# Patient Record
Sex: Female | Born: 1965 | Race: White | Hispanic: No | Marital: Single | Smoking: Current every day smoker
Health system: Southern US, Community
[De-identification: ages and names within clinical notes are randomized; demographics above are authoritative.]

## PROBLEM LIST (undated history)

## (undated) ENCOUNTER — Emergency Department (HOSPITAL_BASED_OUTPATIENT_CLINIC_OR_DEPARTMENT_OTHER): Admission: EM | Payer: BLUE CROSS/BLUE SHIELD

## (undated) DIAGNOSIS — R7303 Prediabetes: Secondary | ICD-10-CM

## (undated) DIAGNOSIS — J302 Other seasonal allergic rhinitis: Secondary | ICD-10-CM

## (undated) HISTORY — PX: BACK SURGERY: SHX140

## (undated) HISTORY — PX: OTHER SURGICAL HISTORY: SHX169

## (undated) HISTORY — DX: Prediabetes: R73.03

## (undated) HISTORY — PX: MOUTH SURGERY: SHX715

## (undated) HISTORY — PX: TYMPANOSTOMY TUBE PLACEMENT: SHX32

## (undated) HISTORY — PX: TONSILLECTOMY AND ADENOIDECTOMY: SHX28

## (undated) HISTORY — PX: TUBAL LIGATION: SHX77

## (undated) HISTORY — DX: Other seasonal allergic rhinitis: J30.2

---

## 2005-09-14 ENCOUNTER — Encounter (INDEPENDENT_AMBULATORY_CARE_PROVIDER_SITE_OTHER): Payer: Self-pay | Admitting: *Deleted

## 2005-09-15 ENCOUNTER — Ambulatory Visit (HOSPITAL_COMMUNITY): Admission: RE | Admit: 2005-09-15 | Discharge: 2005-09-15 | Payer: Self-pay | Admitting: Orthopedic Surgery

## 2008-11-18 ENCOUNTER — Other Ambulatory Visit: Admission: RE | Admit: 2008-11-18 | Discharge: 2008-11-18 | Payer: Self-pay | Admitting: Obstetrics & Gynecology

## 2010-05-17 ENCOUNTER — Other Ambulatory Visit (HOSPITAL_COMMUNITY)
Admission: RE | Admit: 2010-05-17 | Discharge: 2010-05-17 | Disposition: A | Discharge status: Home / Self Care | Payer: BC Managed Care – PPO | Source: Ambulatory Visit | Attending: Obstetrics & Gynecology | Admitting: Obstetrics & Gynecology

## 2010-05-17 ENCOUNTER — Other Ambulatory Visit: Payer: Self-pay | Admitting: Obstetrics & Gynecology

## 2010-05-17 DIAGNOSIS — Z01419 Encounter for gynecological examination (general) (routine) without abnormal findings: Secondary | ICD-10-CM | POA: Insufficient documentation

## 2010-05-17 DIAGNOSIS — Z113 Encounter for screening for infections with a predominantly sexual mode of transmission: Secondary | ICD-10-CM | POA: Insufficient documentation

## 2010-06-03 NOTE — Op Note (Signed)
NAME:  Christine Schmitt, JOY NO.:  [id number]   MEDICAL RECORD NO.:  [id number]          PATIENT TYPE:  AMB   LOCATION:  DAY                          FACILITY:  [facility]   PHYSICIAN:  Marlowe Kays, M.D.  DATE OF BIRTH:  June 23, 1965   DATE OF PROCEDURE:  09/14/2005  DATE OF DISCHARGE:                                 OPERATIVE REPORT   PREOPERATIVE DIAGNOSIS:  Herniated nucleus pulposus L4-5, L5-S1, left.   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   OPERATION:  Microdiskectomy L4-5, L5-S1 left.   SURGEON:  Marlowe Kays, M.D.   ASSISTANT:  Georges Lynch. Darrelyn Hillock, M.D.   ANESTHESIA:  General.   PATHOLOGY AND JUSTIFICATION FOR PROCEDURE:  She has had pain in back and  left leg since around the first of June of this year.  MRIs demonstrated  disk herniations at both levels L4-5 and L5-S1 central with some right-sided  involvement though her symptoms have been on the left.  She has numbness  over the L5-S1 nerve root distribution so it is felt that microdiskectomy  both levels was indicated and since she was symptomatic only left side we  went in on that side only.  All this was thoroughly discussed with her prior  to surgery.   PROCEDURE NOTE:  Prophylactic antibiotic, satisfactory general anesthesia,  knee-chest position on the [place] frame.  Back was prepped with DuraPrep  and with two spinal needles and lateral x-ray, we tentatively located L4-5  and L5-S1.  We then continued draping the back in sterile field.  Made  midline incision and dissected soft tissue off what we anticipated to be the  lamina of L4 and L5.  I tagged these two spinous processes with Kocher  clamps took a second lateral x-ray and Kocher clamps appeared to be on the  spinous process L4 and L5.  Based on this I continued dissecting soft tissue  off the lamina of L4, L5 and S1 placed self-retaining McCullough retractor  and then began at L5-S1 removing a portion of  the inferior lamina of L5 with  double-action rongeur followed by 2-3 mm Kerrison rongeurs.  She had fairly  significant shingling effect there and had to remove a good bit of bone for  adequate exposure.  I then performed same procedure L4-5 removing portion of  the inferior arch of L4 with a similar dissection.  At this point I brought  in the microscope and working first L5-S1, completed the decompression.  We  identified the S1 nerve root and were able to remove it medially.  She had  large venous plexus laterally which I cauterized with bipolar cautery.  The  disk herniation was identified and opened with a 15 knife blade, the disk  space was narrow.  There was less disk material there than would have been  apparent on the MRI.  We looked for free fragments and found none.  We  removed all disk material obtainable from the interspace with combination of  straight and angled up bite pituitaries, Epstein curette and nerve hook.  The posterior longitudinal ligament  was quite thickened and a lot of the  bulge appeared to be due to spondylosis.  The fairly significant lateral  recess stenosis was thoroughly decompressed and at conclusion of the  microdiskectomy at this level her S1 nerve root was lying free in the  foramen and was freely movable.  We then irrigated the wound well, placed  Gelfoam over the dura and the interspace and moved to L4-5, where we  performed essentially the same final dissection.  There was actually more  disk material L4-5 and we once again removed all possible treating this  interspace as we had L5-S1.  Once again irrigation Gelfoam was placed, self-  retaining retractors were removed, there was no unusual bleeding.  I then  began closure with interrupted #1 Vicryl in the fascia, she is given 30 mg  of Toradol IV.  I used a #1 the deep  subcutaneous tissue, 2-0 Vicryl superficial subcutaneous tissue, staples in  the skin, subcu tissue was infiltrated with half  percent plain Marcaine.  Betadine Adaptic dry sterile dressing were applied.  She tolerated the  procedure well and was taken to recovery in satisfactory condition with no  known complications.           ______________________________  Marlowe Kays, M.D.     JA/MEDQ  D:  09/14/2005  T:  09/15/2005  Job:  [id number]

## 2017-04-17 ENCOUNTER — Encounter: Payer: Self-pay | Admitting: Gastroenterology

## 2017-05-15 ENCOUNTER — Ambulatory Visit: Payer: Self-pay

## 2019-08-11 ENCOUNTER — Other Ambulatory Visit (HOSPITAL_COMMUNITY): Payer: Self-pay | Admitting: Internal Medicine

## 2019-08-11 DIAGNOSIS — Z1231 Encounter for screening mammogram for malignant neoplasm of breast: Secondary | ICD-10-CM

## 2019-08-22 ENCOUNTER — Encounter: Payer: Self-pay | Admitting: Internal Medicine

## 2019-09-03 ENCOUNTER — Ambulatory Visit (HOSPITAL_COMMUNITY)
Admission: RE | Admit: 2019-09-03 | Discharge: 2019-09-03 | Disposition: A | Discharge status: Home / Self Care | Payer: Managed Care, Other (non HMO) | Source: Ambulatory Visit | Attending: Internal Medicine | Admitting: Internal Medicine

## 2019-09-03 ENCOUNTER — Other Ambulatory Visit: Payer: Self-pay

## 2019-09-03 DIAGNOSIS — Z1231 Encounter for screening mammogram for malignant neoplasm of breast: Secondary | ICD-10-CM | POA: Diagnosis present

## 2019-09-12 ENCOUNTER — Other Ambulatory Visit (HOSPITAL_COMMUNITY): Payer: Self-pay | Admitting: Internal Medicine

## 2019-09-12 DIAGNOSIS — R928 Other abnormal and inconclusive findings on diagnostic imaging of breast: Secondary | ICD-10-CM

## 2019-09-30 ENCOUNTER — Ambulatory Visit (HOSPITAL_COMMUNITY): Payer: Managed Care, Other (non HMO)

## 2019-09-30 ENCOUNTER — Encounter (HOSPITAL_COMMUNITY): Payer: Managed Care, Other (non HMO)

## 2019-10-21 ENCOUNTER — Ambulatory Visit (HOSPITAL_COMMUNITY)
Admission: RE | Admit: 2019-10-21 | Discharge: 2019-10-21 | Disposition: A | Discharge status: Home / Self Care | Payer: Managed Care, Other (non HMO) | Source: Ambulatory Visit | Attending: Internal Medicine | Admitting: Internal Medicine

## 2019-10-21 ENCOUNTER — Other Ambulatory Visit: Payer: Self-pay

## 2019-10-21 DIAGNOSIS — R928 Other abnormal and inconclusive findings on diagnostic imaging of breast: Secondary | ICD-10-CM

## 2019-10-23 ENCOUNTER — Ambulatory Visit: Payer: Managed Care, Other (non HMO) | Admitting: Gastroenterology

## 2019-10-29 ENCOUNTER — Encounter: Payer: Self-pay | Admitting: Gastroenterology

## 2019-10-29 ENCOUNTER — Encounter: Payer: Self-pay | Admitting: *Deleted

## 2019-10-29 ENCOUNTER — Other Ambulatory Visit: Payer: Self-pay

## 2019-10-29 ENCOUNTER — Ambulatory Visit (INDEPENDENT_AMBULATORY_CARE_PROVIDER_SITE_OTHER): Payer: Managed Care, Other (non HMO) | Admitting: Gastroenterology

## 2019-10-29 DIAGNOSIS — Z1211 Encounter for screening for malignant neoplasm of colon: Secondary | ICD-10-CM | POA: Diagnosis not present

## 2019-10-29 NOTE — Progress Notes (Signed)
Primary Care Physician:  Benita Stabile, MD  Referring Physician: Dr. Margo Aye  Primary Gastroenterologist:  Dr. Marletta Lor   Chief Complaint  Patient presents with  . Consult    TCS never done prior. MGM colon cancer in 60's    HPI:   Christine Schmitt is a 54 y.o. female presenting today at the request of Dr. Margo Aye for initial screening colonoscopy.    Reports her maternal grandmother had colon cancer in her 81s, but she has no first degree relatives with history of colon cancer. No family history of colon cancer. No abdominal pain, N/V, changes in bowel habits, overt GI bleeding, unexplained weight loss or lack of appetite. Rare reflux depending on what she eats. No dysphagia.   Past Medical History:  Diagnosis Date  . Pre-diabetes   . Seasonal allergies     Past Surgical History:  Procedure Laterality Date  . BACK SURGERY    . carpal tunnel repair    . MOUTH SURGERY    . TONSILLECTOMY AND ADENOIDECTOMY    . TUBAL LIGATION    . TYMPANOSTOMY TUBE PLACEMENT      Current Outpatient Medications  Medication Sig Dispense Refill  . buPROPion (WELLBUTRIN XL) 150 MG 24 hr tablet Take 150 mg by mouth every morning.    . diazepam (VALIUM) 5 MG tablet Take 5 mg by mouth daily as needed. For upcoming oral surgeon    . levocetirizine (XYZAL) 5 MG tablet Take 1 tablet by mouth at bedtime.     No current facility-administered medications for this visit.    Allergies as of 10/29/2019 - Review Complete 10/29/2019  Allergen Reaction Noted  . Sulfamethoxazole Diarrhea and Nausea And Vomiting 12/05/2018  . Codeine Rash 12/05/2018    Family History  Problem Relation Age of Onset  . Colon cancer Maternal Grandmother   . Colon polyps Neg Hx     Social History   Socioeconomic History  . Marital status: Single    Spouse name: Not on file  . Number of children: Not on file  . Years of education: Not on file  . Highest education level: Not on file  Occupational History  . Occupation:  [employer]    Comment: [place], [place]  Tobacco Use  . Smoking status: Current Every Day Smoker  . Smokeless tobacco: Never Used  . Tobacco comment: down to 0.5 packs per day  Substance and Sexual Activity  . Alcohol use: Not Currently    Comment: none in 4 years  . Drug use: Never  . Sexual activity: Not on file  Other Topics Concern  . Not on file  Social History Narrative  . Not on file   Social Determinants of Health   Financial Resource Strain:   . Difficulty of Paying Living Expenses: Not on file  Food Insecurity:   . Worried About Programme researcher, broadcasting/film/video in the Last Year: Not on file  . Ran Out of Food in the Last Year: Not on file  Transportation Needs:   . Lack of Transportation (Medical): Not on file  . Lack of Transportation (Non-Medical): Not on file  Physical Activity:   . Days of Exercise per Week: Not on file  . Minutes of Exercise per Session: Not on file  Stress:   . Feeling of Stress : Not on file  Social Connections:   . Frequency of Communication with Friends and Family: Not on file  . Frequency of Social Gatherings with Friends and Family: Not on file  .  Attends Religious Services: Not on file  . Active Member of Clubs or Organizations: Not on file  . Attends Banker Meetings: Not on file  . Marital Status: Not on file  Intimate Partner Violence:   . Fear of Current or Ex-Partner: Not on file  . Emotionally Abused: Not on file  . Physically Abused: Not on file  . Sexually Abused: Not on file    Review of Systems: Gen: Denies any fever, chills, fatigue, weight loss, lack of appetite.  CV: Denies chest pain, heart palpitations, peripheral edema, syncope.  Resp: Denies shortness of breath at rest or with exertion. Denies wheezing or cough.  GI: see HPI GU : Denies urinary burning, urinary frequency, urinary hesitancy MS: Denies joint pain, muscle weakness, cramps, or limitation of movement.  Derm: Denies rash, itching, dry skin Psych:  Denies depression, anxiety, memory loss, and confusion Heme: Denies bruising, bleeding, and enlarged lymph nodes.  Physical Exam: BP 105/61   Pulse 64   Temp (!) 97.1 F (36.2 C)   Ht 5\' 4"  (1.626 m)   Wt 163 lb (73.9 kg)   BMI 27.98 kg/m  General:   Alert and oriented. Pleasant and cooperative. Well-nourished and well-developed.  Head:  Normocephalic and atraumatic. Eyes:  Without icterus, sclera clear and conjunctiva pink.  Ears:  Normal auditory acuity. Mouth:  Mask in place Lungs:  Clear to auscultation bilaterally. No wheezes, rales, or rhonchi. No distress.  Heart:  S1, S2 present without murmurs appreciated.  Abdomen:  +BS, soft, non-tender and non-distended. No HSM noted. No guarding or rebound. No masses appreciated.  Rectal:  Deferred  Msk:  Symmetrical without gross deformities. Normal posture. Extremities:  Without edema. Neurologic:  Alert and  oriented x4;  grossly normal neurologically. Skin:  Intact without significant lesions or rashes. Psych:  Alert and cooperative. Normal mood and affect.  ASSESSMENT: Christine Schmitt is a 54 y.o. female presenting today with need for initial screening colonoscopy, without any concerning upper or lower GI signs/symptoms. She has no first-degree relatives with history of colorectal cancer or polyps (grandmother with colon cancer).    PLAN:  Proceed with colonoscopy by Dr. 40  in near future: the risks, benefits, and alternatives have been discussed with the patient in detail. The patient states understanding and desires to proceed.   Further recommendations to follow  Marletta Lor, PhD, ANP-BC [facility]

## 2019-10-29 NOTE — Patient Instructions (Signed)
We are arranging a colonoscopy in the near future!  Further recommendations to follow!  It was a pleasure to see you today. I want to create trusting relationships with patients to provide genuine, compassionate, and quality care. I value your feedback. If you receive a survey regarding your visit,  I greatly appreciate you taking time to fill this out.   Shifa Brisbon W. Tyde Lamison, PhD, ANP-BC [facility]   

## 2019-11-03 ENCOUNTER — Encounter: Payer: Self-pay | Admitting: Gastroenterology

## 2019-11-28 ENCOUNTER — Encounter (HOSPITAL_COMMUNITY): Payer: Self-pay | Admitting: Anesthesiology

## 2019-12-01 ENCOUNTER — Telehealth: Payer: Self-pay | Admitting: Internal Medicine

## 2019-12-01 NOTE — Telephone Encounter (Signed)
Called pt. She wants to r/s procedure to after the new year. Procedure rescheduled to 1/7 at 10:00am. Aware will mail new covid test appt with instructions. Called endo and also made aware of appt change.

## 2019-12-01 NOTE — Telephone Encounter (Signed)
Pt wants to reschedule her procedure for this Friday with Dr Marletta Lor [phone]

## 2019-12-04 ENCOUNTER — Other Ambulatory Visit (HOSPITAL_COMMUNITY): Payer: Managed Care, Other (non HMO)

## 2020-01-21 ENCOUNTER — Telehealth: Payer: Self-pay | Admitting: Internal Medicine

## 2020-01-21 NOTE — Telephone Encounter (Signed)
Pt wants to cancel her procedure with Dr Marletta Lor for 01/23/2020 and reschedule later on due to the increase of covid cases. [phone]

## 2020-01-21 NOTE — Telephone Encounter (Signed)
LMVOM for pt to call back if she wants to reschedule. Called endo and made aware to place on depot for now

## 2020-01-22 ENCOUNTER — Other Ambulatory Visit (HOSPITAL_COMMUNITY)
Admission: RE | Admit: 2020-01-22 | Discharge: 2020-01-22 | Disposition: A | Discharge status: Home / Self Care | Payer: Managed Care, Other (non HMO) | Source: Ambulatory Visit | Attending: Internal Medicine | Admitting: Internal Medicine

## 2020-01-23 ENCOUNTER — Encounter (HOSPITAL_COMMUNITY): Admission: RE | Payer: Self-pay | Source: Home / Self Care

## 2020-01-23 ENCOUNTER — Ambulatory Visit (HOSPITAL_COMMUNITY): Admission: RE | Admit: 2020-01-23 | Payer: Managed Care, Other (non HMO) | Source: Home / Self Care

## 2020-01-23 SURGERY — COLONOSCOPY WITH PROPOFOL
Anesthesia: Monitor Anesthesia Care

## 2020-03-31 ENCOUNTER — Other Ambulatory Visit (HOSPITAL_COMMUNITY): Payer: Self-pay | Admitting: Internal Medicine

## 2020-03-31 DIAGNOSIS — N63 Unspecified lump in unspecified breast: Secondary | ICD-10-CM

## 2020-04-27 ENCOUNTER — Other Ambulatory Visit: Payer: Self-pay

## 2020-04-27 ENCOUNTER — Ambulatory Visit (HOSPITAL_COMMUNITY)
Admission: RE | Admit: 2020-04-27 | Discharge: 2020-04-27 | Disposition: A | Discharge status: Home / Self Care | Payer: Managed Care, Other (non HMO) | Source: Ambulatory Visit | Attending: Internal Medicine | Admitting: Internal Medicine

## 2020-04-27 DIAGNOSIS — N63 Unspecified lump in unspecified breast: Secondary | ICD-10-CM | POA: Diagnosis not present

## 2020-11-10 ENCOUNTER — Other Ambulatory Visit (HOSPITAL_COMMUNITY): Payer: Self-pay | Admitting: Internal Medicine

## 2020-11-10 DIAGNOSIS — N6320 Unspecified lump in the left breast, unspecified quadrant: Secondary | ICD-10-CM

## 2020-12-14 ENCOUNTER — Ambulatory Visit (HOSPITAL_COMMUNITY)
Admission: RE | Admit: 2020-12-14 | Discharge: 2020-12-14 | Disposition: A | Discharge status: Home / Self Care | Payer: Managed Care, Other (non HMO) | Source: Ambulatory Visit | Attending: Internal Medicine | Admitting: Internal Medicine

## 2020-12-14 ENCOUNTER — Other Ambulatory Visit: Payer: Self-pay

## 2020-12-14 DIAGNOSIS — N6320 Unspecified lump in the left breast, unspecified quadrant: Secondary | ICD-10-CM | POA: Diagnosis present

## 2021-05-10 IMAGING — MG MM DIGITAL DIAGNOSTIC UNILAT*L* W/ TOMO W/ CAD
4 series · 4 of 12 positions shown · non-contrast
Comparison: Previous exams.

CLINICAL DATA: Screening recall for probably benign left breast
mass.

EXAM:
DIGITAL DIAGNOSTIC UNILATERAL LEFT MAMMOGRAM WITH TOMOSYNTHESIS AND
CAD; ULTRASOUND LEFT BREAST LIMITED
TECHNIQUE: Left digital diagnostic mammography and breast tomosynthesis was
performed. The images were evaluated with computer-aided detection.;
Targeted ultrasound examination of the left breast was performed

[L CC synth-2D]
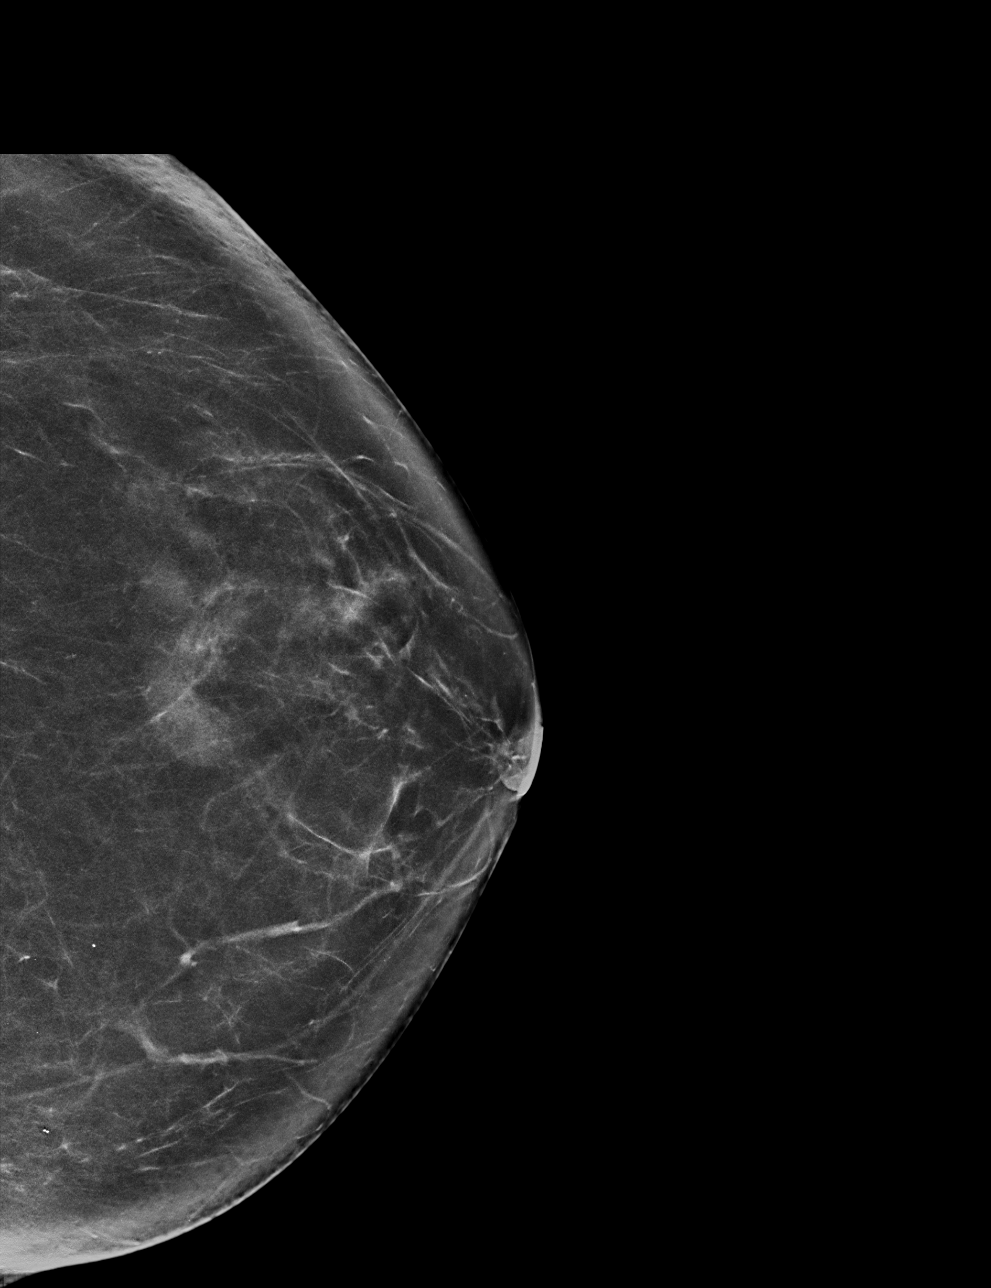

[L MLO synth-2D]
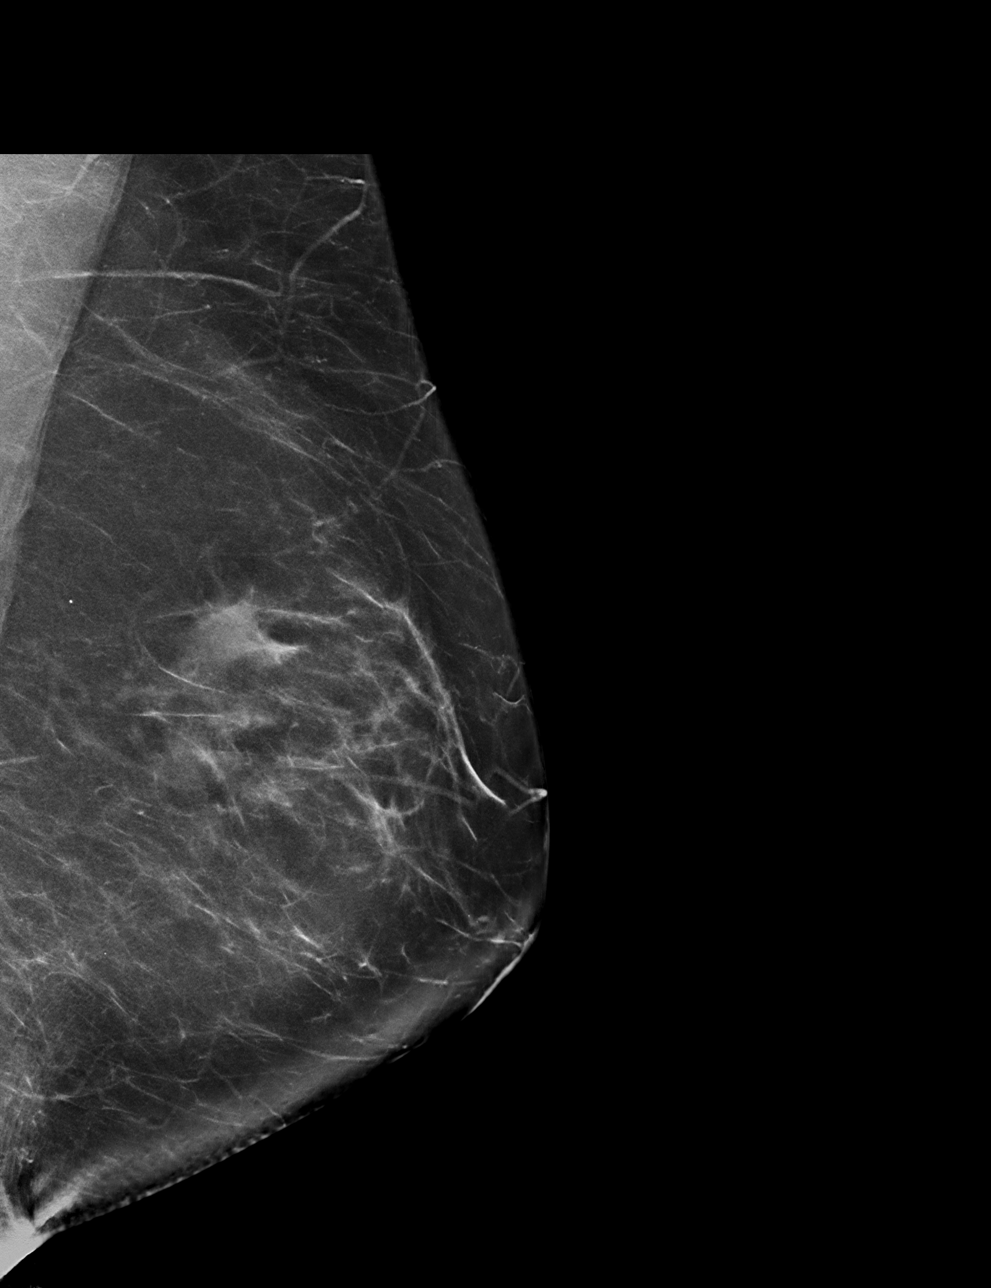

[L CC tomo · tomo slice 37/73.0]
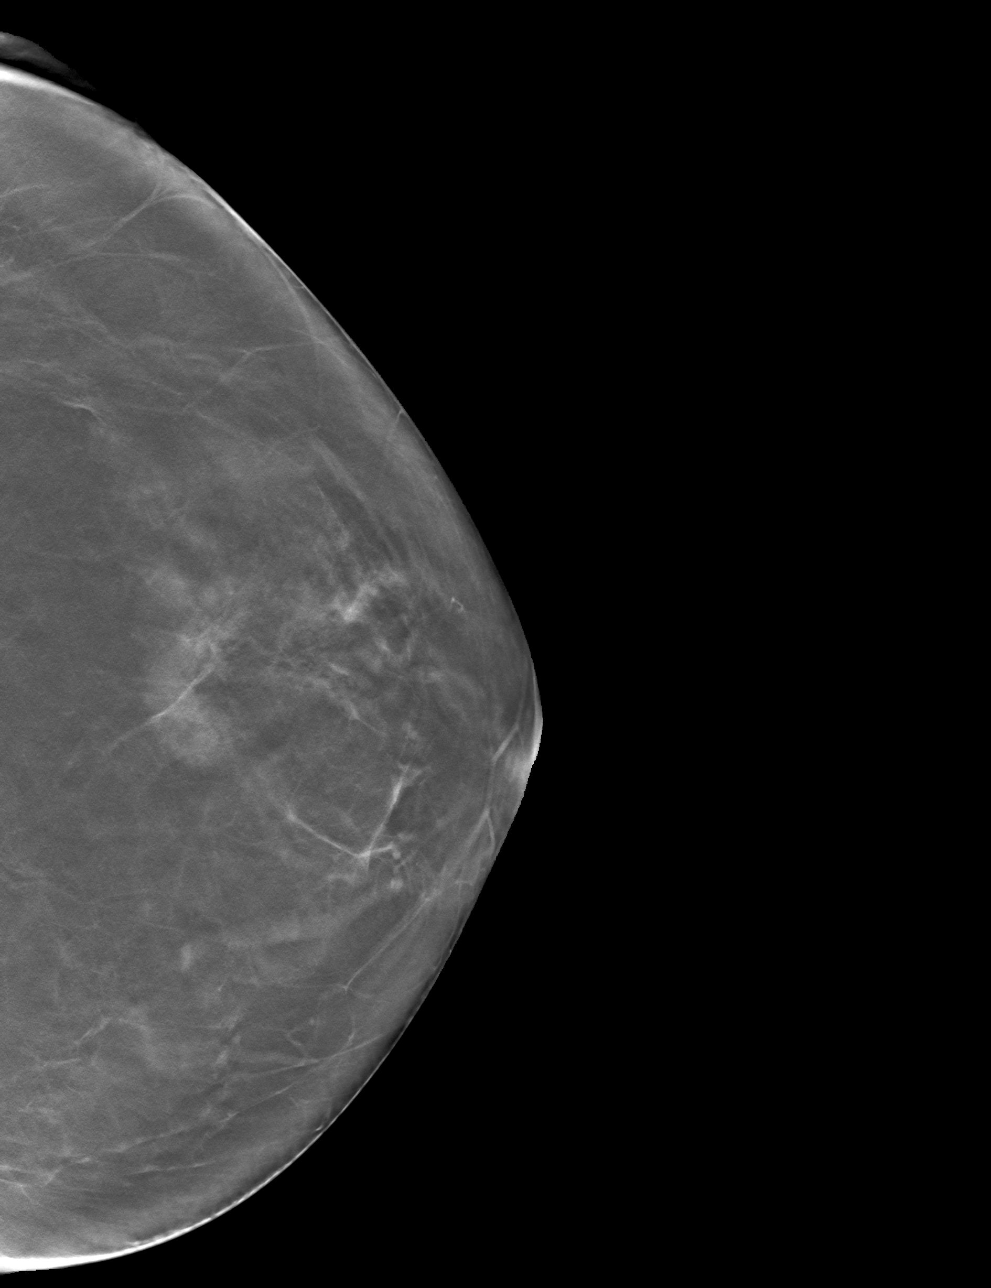

[L MLO tomo · tomo slice 39/76.0]
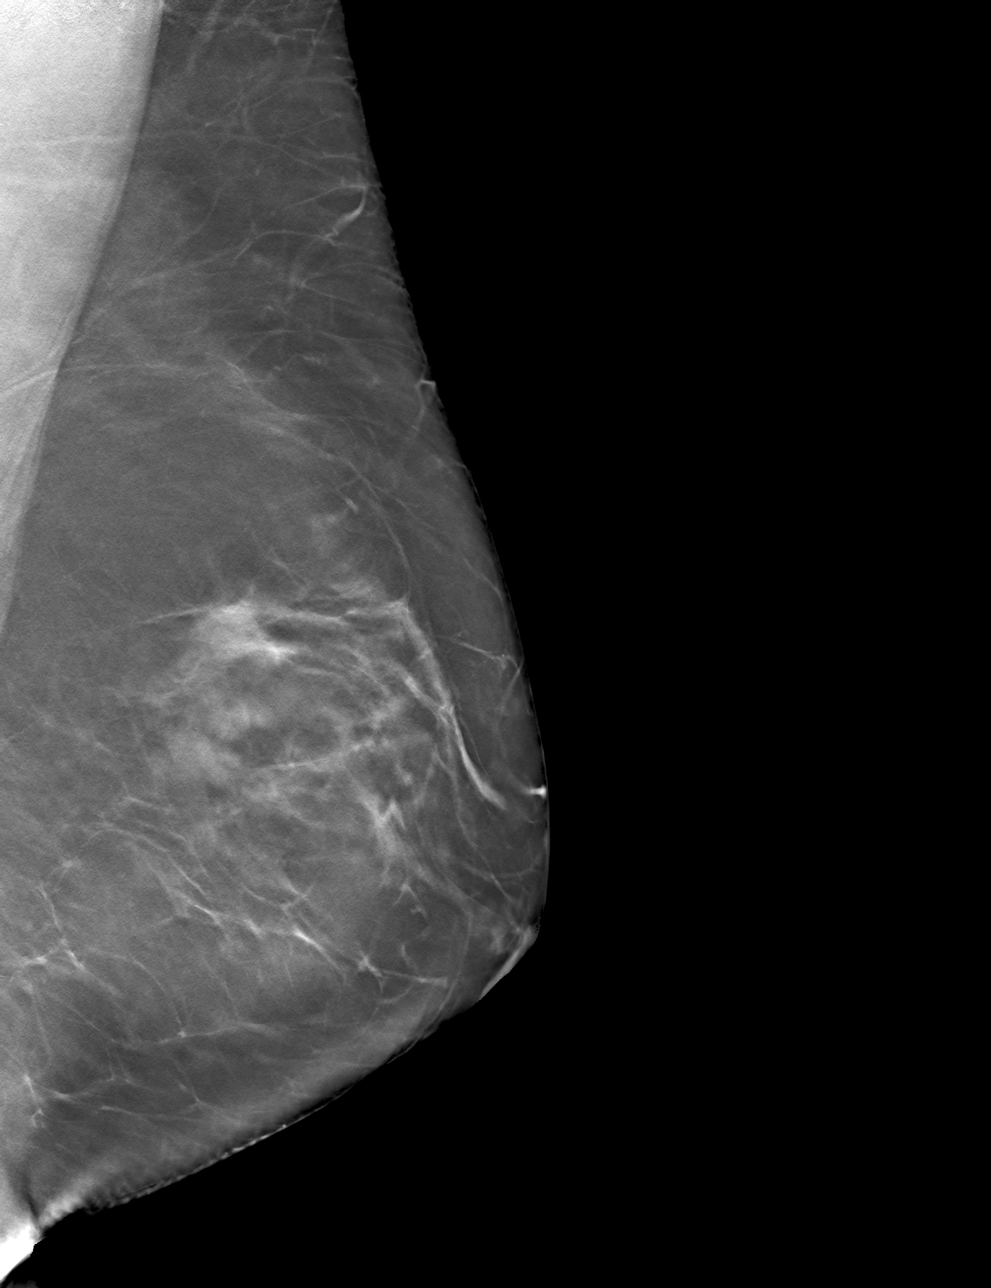

[4 of 12 positions shown; findings below may reference images not displayed]

ACR Breast Density Category b: There are scattered areas of
fibroglandular density.
FINDINGS: No suspicious masses or calcifications are seen in the left breast.
The oval mass with obscured margins in the upper central left breast
appears unchanged when compared to the prior mammograms.

Targeted ultrasound of the left breast was performed. The oval
circumscribed mass in the left breast at 12 o'clock 2 cm from nipple
posterior depth measures 0.8 x 0.3 x 0.9 cm. This is unchanged in
size in appearance when compared to the prior exam.
IMPRESSION: Stable probably benign left breast mass. There is no mammographic
evidence of malignancy in the left breast.

RECOMMENDATION:
Bilateral diagnostic mammography August 2020 which will demonstrate
2 years of stability of the probably benign left breast mass.

I have discussed the findings and recommendations with the patient.
If applicable, a reminder letter will be sent to the patient
regarding the next appointment.

BI-RADS CATEGORY  3: Probably benign.

## 2021-05-10 IMAGING — US US BREAST*L* LIMITED INC AXILLA
1 series · 6 of 6 positions shown · non-contrast
Comparison: Previous exams.

CLINICAL DATA: Screening recall for probably benign left breast
mass.

EXAM:
DIGITAL DIAGNOSTIC UNILATERAL LEFT MAMMOGRAM WITH TOMOSYNTHESIS AND
CAD; ULTRASOUND LEFT BREAST LIMITED
TECHNIQUE: Left digital diagnostic mammography and breast tomosynthesis was
performed. The images were evaluated with computer-aided detection.;
Targeted ultrasound examination of the left breast was performed

[Series 1: us breast*left* limited inc axilla · 0.07mm/px · 6 of 6 slices shown]
[im 1/6]
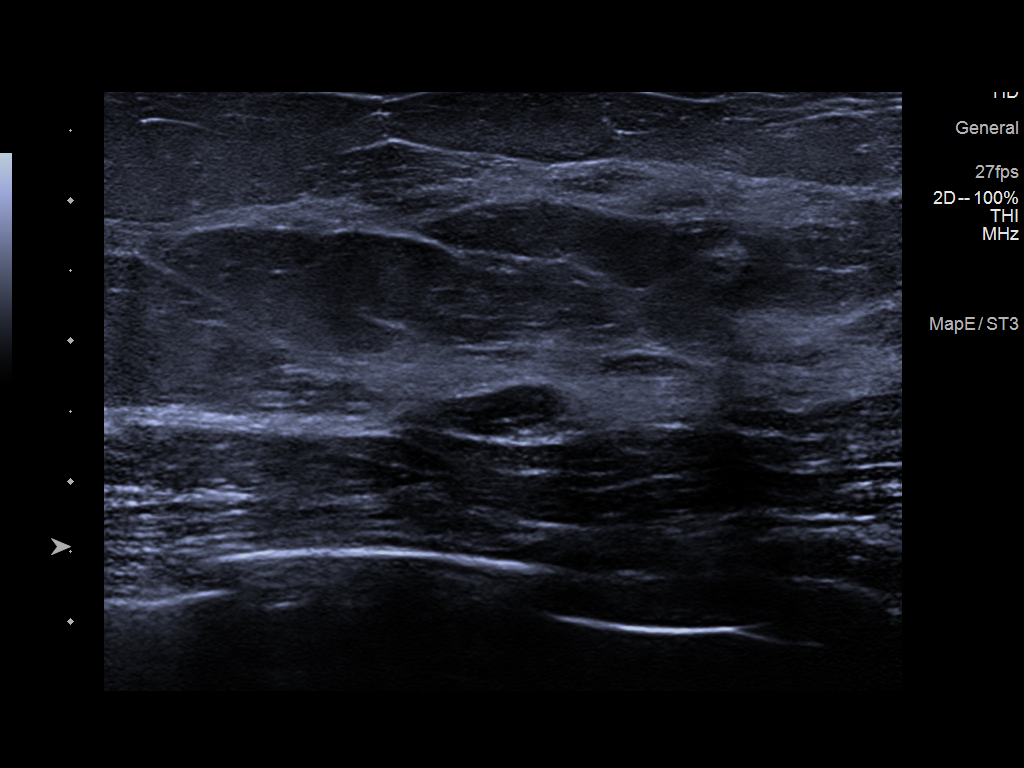
[im 2/6]
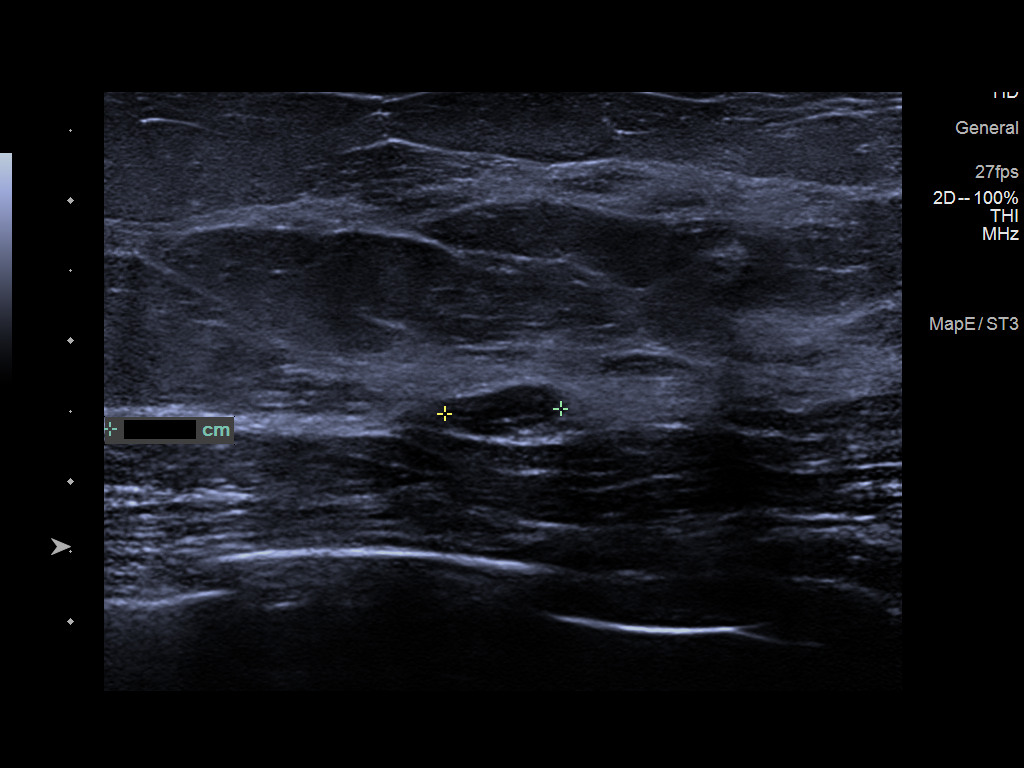
[im 3/6]
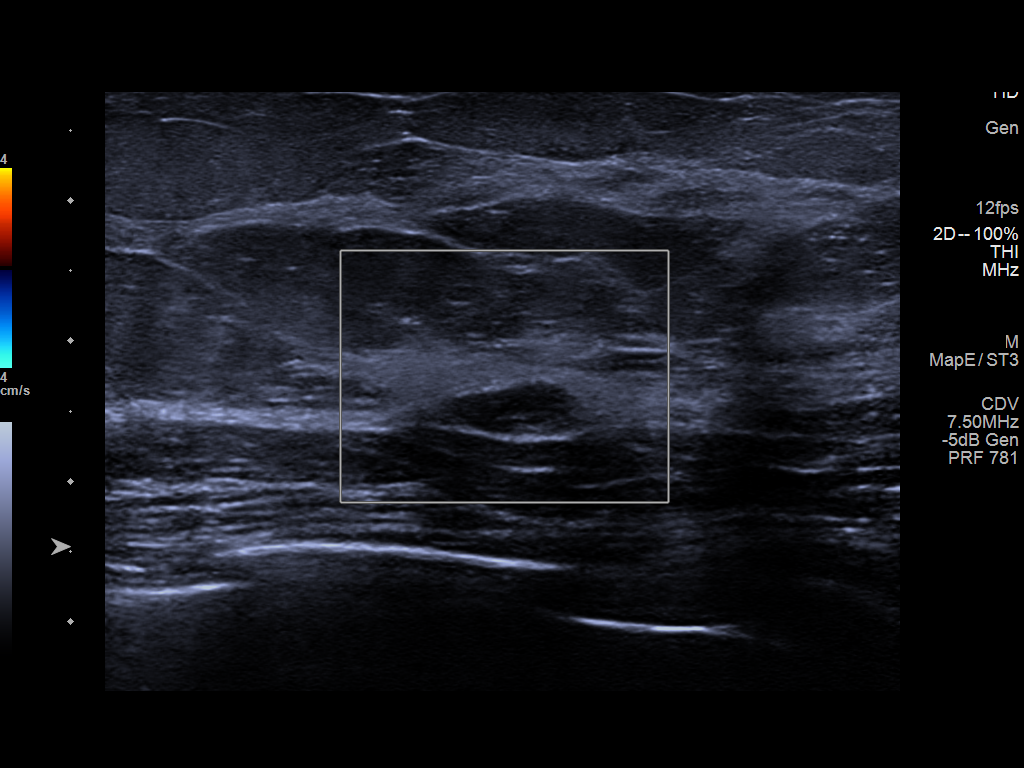
[im 4/6]
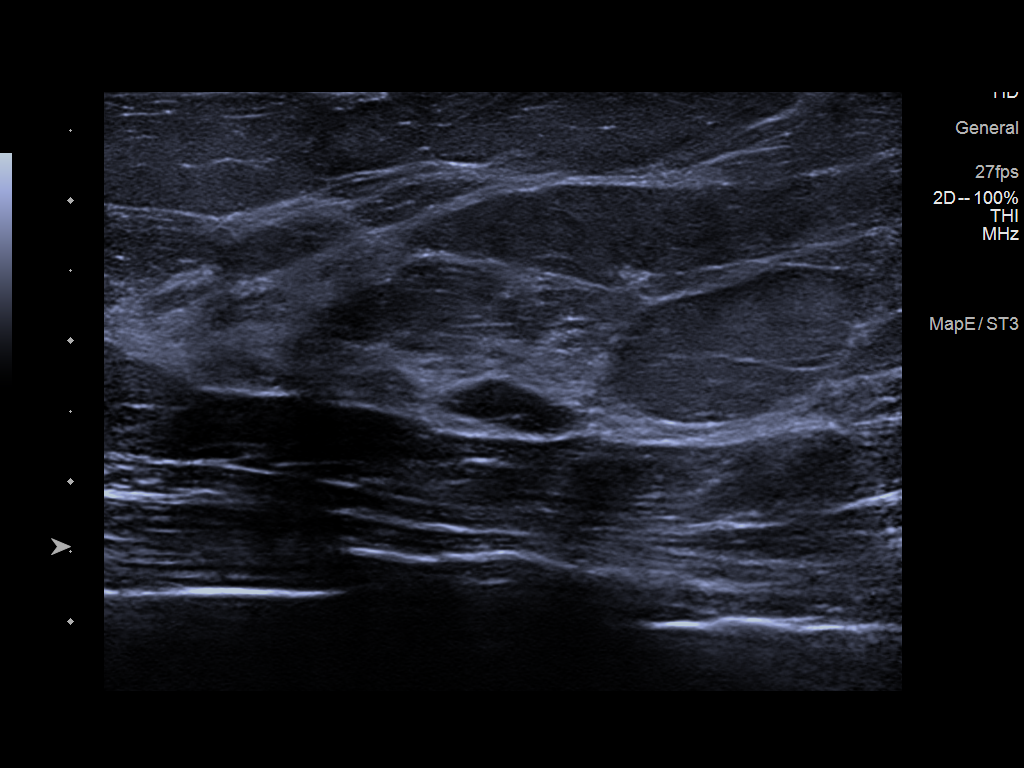
[im 5/6]
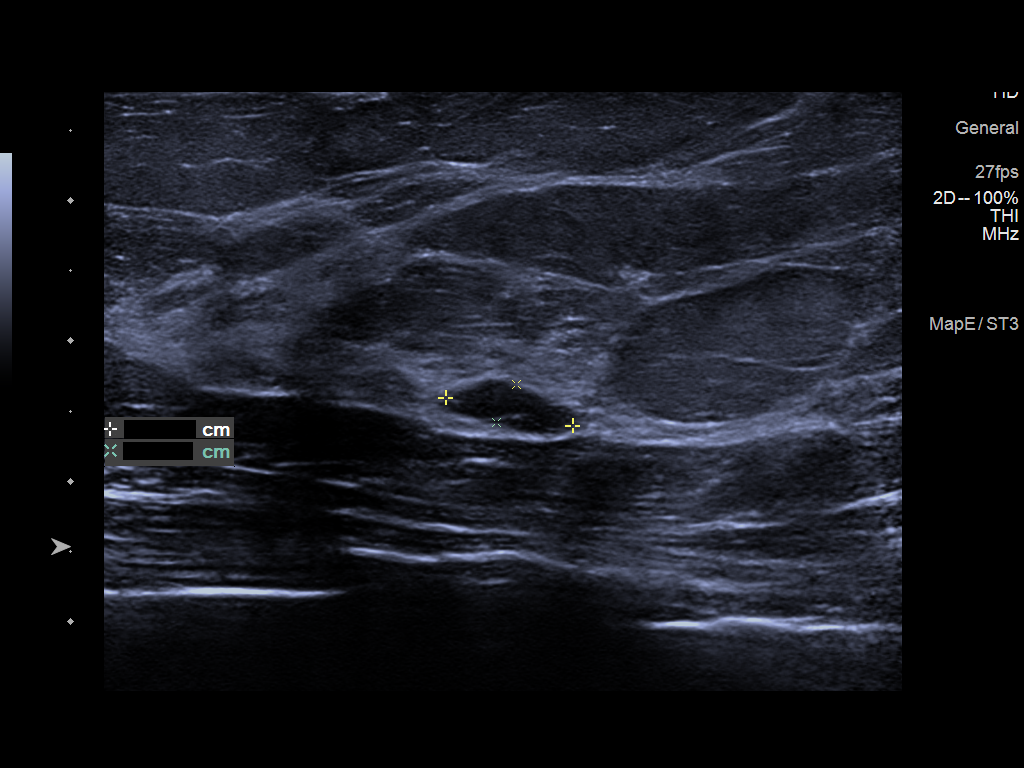
[im 6/6]
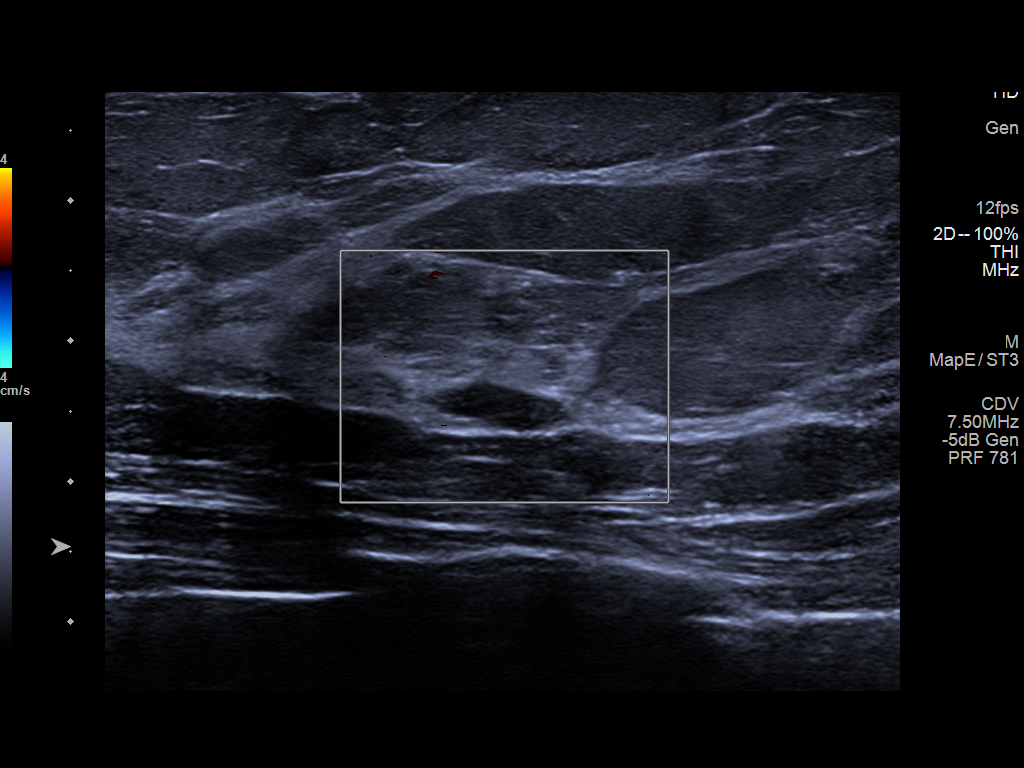

[6 of 6 positions shown; findings below may reference images not displayed]

ACR Breast Density Category b: There are scattered areas of
fibroglandular density.
FINDINGS: No suspicious masses or calcifications are seen in the left breast.
The oval mass with obscured margins in the upper central left breast
appears unchanged when compared to the prior mammograms.

Targeted ultrasound of the left breast was performed. The oval
circumscribed mass in the left breast at 12 o'clock 2 cm from nipple
posterior depth measures 0.8 x 0.3 x 0.9 cm. This is unchanged in
size in appearance when compared to the prior exam.
IMPRESSION: Stable probably benign left breast mass. There is no mammographic
evidence of malignancy in the left breast.

RECOMMENDATION:
Bilateral diagnostic mammography August 2020 which will demonstrate
2 years of stability of the probably benign left breast mass.

I have discussed the findings and recommendations with the patient.
If applicable, a reminder letter will be sent to the patient
regarding the next appointment.

BI-RADS CATEGORY  3: Probably benign.

## 2021-09-09 ENCOUNTER — Ambulatory Visit: Payer: Managed Care, Other (non HMO) | Admitting: Family Medicine

## 2021-11-22 ENCOUNTER — Other Ambulatory Visit (HOSPITAL_COMMUNITY): Payer: Self-pay | Admitting: Family Medicine

## 2021-11-22 DIAGNOSIS — N6320 Unspecified lump in the left breast, unspecified quadrant: Secondary | ICD-10-CM

## 2022-01-24 ENCOUNTER — Ambulatory Visit (HOSPITAL_COMMUNITY)
Admission: RE | Admit: 2022-01-24 | Discharge: 2022-01-24 | Disposition: A | Discharge status: Home / Self Care | Payer: BLUE CROSS/BLUE SHIELD | Source: Ambulatory Visit | Attending: Family Medicine | Admitting: Family Medicine

## 2022-01-24 ENCOUNTER — Ambulatory Visit (HOSPITAL_COMMUNITY)
Admission: RE | Admit: 2022-01-24 | Discharge: 2022-01-24 | Discharge status: Home / Self Care | Payer: BLUE CROSS/BLUE SHIELD | Source: Ambulatory Visit | Attending: Family Medicine

## 2022-01-24 ENCOUNTER — Encounter (HOSPITAL_COMMUNITY): Payer: Self-pay

## 2022-01-24 DIAGNOSIS — N6320 Unspecified lump in the left breast, unspecified quadrant: Secondary | ICD-10-CM

## 2022-04-17 ENCOUNTER — Emergency Department (HOSPITAL_COMMUNITY)
Admission: EM | Admit: 2022-04-17 | Discharge: 2022-04-17 | Disposition: A | Discharge status: Home / Self Care | Payer: BLUE CROSS/BLUE SHIELD | Attending: Emergency Medicine | Admitting: Emergency Medicine

## 2022-04-17 ENCOUNTER — Other Ambulatory Visit: Payer: Self-pay

## 2022-04-17 DIAGNOSIS — M5416 Radiculopathy, lumbar region: Secondary | ICD-10-CM | POA: Insufficient documentation

## 2022-04-17 DIAGNOSIS — M545 Low back pain, unspecified: Secondary | ICD-10-CM | POA: Diagnosis present

## 2022-04-17 MED ORDER — CYCLOBENZAPRINE HCL 10 MG PO TABS: 10.0000 mg | ORAL_TABLET | Freq: Two times a day (BID) | ORAL | 0 refills | Status: AC | PRN

## 2022-04-17 MED ORDER — KETOROLAC TROMETHAMINE 30 MG/ML IJ SOLN
30.0000 mg | Freq: Once | INTRAMUSCULAR | Status: AC
  Administered 2022-04-17: 30 mg via INTRAMUSCULAR
  Filled 2022-04-17: qty 1

## 2022-04-17 NOTE — Discharge Instructions (Signed)
You were seen in the emergency department for low back pain. Based on my exam of you, it appears that you are currently experiencing lumbar radiculopathy. A dose of toradol was given to you here in the emergency department as well as a prescription for Flexeril to take over the next few days as needed for pain. If you begin to experience groin numbness, loss of bowel or bladder control, or significant weakness in the legs, please return to the emergency department for further evaluation.

## 2022-04-17 NOTE — ED Triage Notes (Signed)
Last week turn her foot and jerk kinda to catch self and tweaked back.  Has been trying with  mortrin,  hot cold compresses.  Pain going down left leg.  States this feels the same as before when she had surgery on L3-L4  20years ago

## 2022-04-18 NOTE — ED Provider Notes (Signed)
[place] Provider Note   CSN: [id number] Arrival date & time: 04/17/22  1305     History Chief Complaint  Patient presents with   Back Pain    Christine Schmitt is a 57 y.o. female.  Patient with past history significant for sciatica presents emergency department complaints of back pain.  She reports that she had an awkward step back and slight twisting of her lower back about a week ago which has now caused pain with radiation down the left leg since then.  She has tried taking ibuprofen and using hot compresses to the area without significant improvement.  Denies any numbness, weakness, saddle paresthesia, bowel or bladder incontinence since symptoms began.  Previously had back surgery at L3/L4 about 20 years ago.   Back Pain      Home Medications Prior to Admission medications   Medication Sig Start Date End Date Taking? Authorizing Provider  cyclobenzaprine (FLEXERIL) 10 MG tablet Take 1 tablet (10 mg total) by mouth 2 (two) times daily as needed for muscle spasms. 04/17/22  Yes Luvenia Heller, PA-C  buPROPion (WELLBUTRIN XL) 150 MG 24 hr tablet Take 150 mg by mouth every morning. 09/03/19   [provider]  diazepam (VALIUM) 5 MG tablet Take 5 mg by mouth daily as needed. For upcoming oral surgeon 08/11/19   [provider]  levocetirizine (XYZAL) 5 MG tablet Take 1 tablet by mouth at bedtime. 01/06/19   [provider]      Allergies    Sulfamethoxazole and Codeine    Review of Systems   Review of Systems  Musculoskeletal:  Positive for back pain.  All other systems reviewed and are negative.   Physical Exam Updated Vital Signs BP 129/66 (BP Location: Right Arm)   Pulse 64   Temp 98 F (36.7 C) (Oral)   Resp 18   SpO2 99%  Physical Exam Vitals and nursing note reviewed.  Constitutional:      General: She is not in acute distress.    Appearance: She is well-developed.  HENT:     Head:  Normocephalic and atraumatic.  Eyes:     Conjunctiva/sclera: Conjunctivae normal.  Cardiovascular:     Rate and Rhythm: Normal rate and regular rhythm.     Heart sounds: No murmur heard. Pulmonary:     Effort: Pulmonary effort is normal. No respiratory distress.     Breath sounds: Normal breath sounds.  Abdominal:     Palpations: Abdomen is soft.     Tenderness: There is no abdominal tenderness.  Musculoskeletal:        General: No swelling.     Cervical back: Neck supple.     Comments: Positive straight leg raise with worsened radiation of pain into posterior leg  Skin:    General: Skin is warm and dry.     Capillary Refill: Capillary refill takes less than 2 seconds.  Neurological:     Mental Status: She is alert.  Psychiatric:        Mood and Affect: Mood normal.     ED Results / Procedures / Treatments   Labs (all labs ordered are listed, but only abnormal results are displayed) Labs Reviewed - No data to display  EKG None  Radiology No results found.  Procedures Procedures   Medications Ordered in ED Medications  ketorolac (TORADOL) 30 MG/ML injection 30 mg (30 mg Intramuscular Given 04/17/22 1609)    ED Course/ Medical Decision Making/ A&P  Medical Decision Making Risk Prescription drug management.   This patient presents to the ED for concern of back pain. Differential diagnosis includes lumbar radiculopathy, arthritis of back, lumbar strain,   Medicines ordered and prescription drug management:  I ordered medication including toradol  for pain  Reevaluation of the patient after these medicines showed that the patient unchanged I have reviewed the patients home medicines and have made adjustments as needed   Problem List / ED Course:  Patient presents to the ED for low back pain. She aggravated the area about a week ago and has not been able to get symptomatic relief since then. She has previously experienced sciatic  symptoms and had surgery on L3-L4. At this time, denies concerning symptoms for cauda equina such as saddle paraesthesia or loss of bowel/bladder control. Patient's symptoms are most consistent with lumbar radiculopathy and should be treated conservatively with course of NSAIDs for a few weeks and gentle stretching. Dose of toradol will be given here in the emergency department to facilitate improvement in symptoms.  Otherwise advised patient monitor symptoms if she begins to experience any significant neurological deficits that would indicate cauda equina syndrome.  Patient agreeable to treatment plan verbalized understanding all return precautions.  All questions answered prior to patient discharge.  Final Clinical Impression(s) / ED Diagnoses Final diagnoses:  Lumbar radiculopathy    Rx / DC Orders ED Discharge Orders          Ordered    cyclobenzaprine (FLEXERIL) 10 MG tablet  2 times daily PRN        04/17/22 1557              Luvenia Heller, PA-C 04/18/22 2334    Hayden Rasmussen, MD 04/19/22 [phone]

## 2022-11-21 ENCOUNTER — Encounter (INDEPENDENT_AMBULATORY_CARE_PROVIDER_SITE_OTHER): Payer: Self-pay | Admitting: Otolaryngology

## 2022-12-21 ENCOUNTER — Ambulatory Visit (INDEPENDENT_AMBULATORY_CARE_PROVIDER_SITE_OTHER): Payer: BLUE CROSS/BLUE SHIELD | Admitting: Otolaryngology

## 2022-12-21 ENCOUNTER — Encounter (INDEPENDENT_AMBULATORY_CARE_PROVIDER_SITE_OTHER): Payer: Self-pay

## 2022-12-21 VITALS — Ht 64.0 in | Wt 170.0 lb

## 2022-12-21 DIAGNOSIS — F17210 Nicotine dependence, cigarettes, uncomplicated: Secondary | ICD-10-CM | POA: Diagnosis not present

## 2022-12-21 DIAGNOSIS — H9212 Otorrhea, left ear: Secondary | ICD-10-CM | POA: Diagnosis not present

## 2022-12-21 DIAGNOSIS — H900 Conductive hearing loss, bilateral: Secondary | ICD-10-CM

## 2022-12-21 DIAGNOSIS — F17200 Nicotine dependence, unspecified, uncomplicated: Secondary | ICD-10-CM

## 2022-12-21 DIAGNOSIS — H6992 Unspecified Eustachian tube disorder, left ear: Secondary | ICD-10-CM | POA: Diagnosis not present

## 2022-12-21 DIAGNOSIS — J301 Allergic rhinitis due to pollen: Secondary | ICD-10-CM | POA: Diagnosis not present

## 2022-12-21 MED ORDER — CIPROFLOXACIN-DEXAMETHASONE 0.3-0.1 % OT SUSP: 4.0000 [drp] | Freq: Two times a day (BID) | OTIC | 1 refills | Status: AC

## 2022-12-21 MED ORDER — AZELASTINE HCL 0.1 % NA SOLN: 2.0000 | Freq: Two times a day (BID) | NASAL | 12 refills | Status: AC

## 2022-12-21 MED ORDER — FLUTICASONE PROPIONATE 50 MCG/ACT NA SUSP: 2.0000 | Freq: Two times a day (BID) | NASAL | 6 refills | Status: AC

## 2022-12-21 NOTE — Patient Instructions (Signed)
Use flonase and astelin sprays - each of them use two sprays each nostril twice daily Use ciprodex drops - left ear twice daily for 14 days (4 drops each time)

## 2022-12-21 NOTE — Progress Notes (Signed)
Dear Dr. Jimmye Norman, Here is my assessment for our mutual patient, Christine Schmitt. Thank you for allowing me the opportunity to care for your patient. Please do not hesitate to contact me should you have any other questions. Sincerely, Dr. Jovita Kussmaul  Otolaryngology Clinic Note  HISTORY: Christine Schmitt is a 57 y.o. female kindly referred by Dr. Jimmye Norman for evaluation of chronic sinusitis.  Initial visit (10/2022): She was seen by Dr. Marene Lenz for her ears previously. She has had the tympanostomy tube since 1990 for ETD after several prior regular tympanostomy tubes. She reports she has had some left otorrhea - couple of days of bloody drainage, and and now draining some (yellow and brown ish) since middle of August (worse in summer). She was placed on Augmentin prior for this, but had vomiting so stopped. Discomfort every now and then, but no pain. Does have some postauricular pressure (comes and goes - maybe once a week, lasts about 20-30 minutes). No vertigo. Does have some left tinnitus, hearing has declined in left ear. Last audio was 2 years ago, no CT scan of sinuses. She had seen Dr. 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 No sinus pressure or pain, no discolored drainage from nose, no frequent sinus infections requiring abx and steroids. She does report some allergies (itchy eyes/nose), nasal congestion and PND. No prior allergy testing  No CT Head or Sinuses  No previous sinonasal surgery.  She is currently using no nasal medications; she has not used flonase, astelin, afrin or other nasal medications She has tried xyzal before.  GLP-1: no AP/AC: no  PMHx: Anxiety  H&N Surgery: multiple BTT (adult) and T&A  She does smoke; 35 pack year  history  RADIOGRAPHIC EVALUATION AND INDEPENDENT REVIEW OF OTHER RECORDS:: Referral notes Jimmye Norman - 11/10/2022) - Sinus issues for a while and noted to have otorrhea; Rx augmentin, ref to ENT Dr. Anders Simmonds notes  08/2020 and 09/23/2020: L otorrhea, h/o T-tube in 1989. Otorrhea since Nov 2021, discolored draiange and occassional pain; 2 ep vertigo. Left hearing loss, and tinnitus. Noted T-tube with otorrhea and granuloma noted and tube partially obstructed with granulation; TM thickened; Rx ciprodex. Noted improvement after ciprodex; los tto follow up it appears; no TFL noted No Eos Audio 2022 independently reviewed done at [facility]: large vol left, and symmetric HL :     Past Medical History:  Diagnosis Date   Pre-diabetes    Seasonal allergies    Past Surgical History:  Procedure Laterality Date   BACK SURGERY     carpal tunnel repair     MOUTH SURGERY     TONSILLECTOMY AND ADENOIDECTOMY     TUBAL LIGATION     TYMPANOSTOMY TUBE PLACEMENT     Family History  Problem Relation Age of Onset   Colon cancer Maternal Grandmother    Colon polyps Neg Hx    Social History   Tobacco Use   Smoking status: Every Day   Smokeless tobacco: Never   Tobacco comments:    down to 0.5 packs per day  Substance Use Topics   Alcohol use: Not Currently    Comment: none in 4 years   Allergies  Allergen Reactions   Augmentin [Amoxicillin-Pot Clavulanate] Nausea And Vomiting    Was vomiting  Sulfamethoxazole Diarrhea and Nausea And Vomiting   Codeine Rash   Current Outpatient Medications  Medication Sig Dispense Refill   azelastine (ASTELIN) 0.1 % nasal spray Place 2 sprays into both nostrils 2 (two) times daily. Use in each nostril as directed 30 mL 12   diazepam (VALIUM) 5 MG tablet Take 5 mg by mouth daily as needed. For upcoming oral surgeon     fluticasone (FLONASE) 50 MCG/ACT nasal spray Place 2 sprays into both nostrils in the morning and at bedtime. 11 mL 6   levocetirizine  (XYZAL) 5 MG tablet Take 1 tablet by mouth at bedtime.     buPROPion (WELLBUTRIN XL) 150 MG 24 hr tablet Take 150 mg by mouth every morning. (Patient not taking: Reported on 12/21/2022)     cyclobenzaprine (FLEXERIL) 10 MG tablet Take 1 tablet (10 mg total) by mouth 2 (two) times daily as needed for muscle spasms. (Patient not taking: Reported on 12/21/2022) 20 tablet 0   No current facility-administered medications for this visit.   Ht 5\' 4"  (1.626 m)   Wt 170 lb (77.1 kg)   BMI 29.18 kg/m   PHYSICAL EXAM:  Ht 5\' 4"  (1.626 m)   Wt 170 lb (77.1 kg)   BMI 29.18 kg/m    Salient findings:  CN II-XII intact Left EAC: clear; TM with T-tube in place, patent; there is some deep discolored drainage (green) which was suctioned clear; no evidence of cholesteatoma, mild TM thickening Right: EAC clear and TM intact with well pneumatized middle ear spaces Weber 512: mid Rinne 512: AC > BC b/l  Nose: Anterior rhinoscopy reveals mild nasal septal deviation and bilateral inferior turbinate hypertrophy.  Nasal endoscopy was indicated to better evaluate the nose and paranasal sinuses, given the patient's history and exam findings, and is detailed below. No lesions of oral cavity/oropharynx No obviously palpable neck masses/lymphadenopathy/thyromegaly No respiratory distress or stridor   Left ear green ish drainage, T-tube in place, patent; right ear normal  PROCEDURE: Diagnostic Nasal Endoscopy Pre-procedure diagnosis: Concern for chronic sinusitis or structural lesion; unilateral eustachian tube dysfunction Post-procedure diagnosis: same Indication: See pre-procedure diagnosis and physical exam above Complications: None apparent EBL: 0 mL Anesthesia: Lidocaine 4% and topical decongestant was topically sprayed in each nasal cavity  Description of Procedure:  Patient was identified. A rigid 30 degree endoscope was utilized to evaluate the sinonasal cavities, mucosa, sinus ostia and turbinates and  septum.  Overall, mild signs of mucosal inflammation are noted.  No mucopurulence, polyps, or masses noted.   Right Middle meatus: clear Right SE Recess: clear Left MM: clear Left SE Recess: clear No masses over eustachian tube; evidence of prior adenoidectomy    Photodocumentation was obtained.  CPT CODE -- 16109 - Mod 25   ASSESSMENT:  Christine Schmitt is a 57 y.o. F with:  1. Otorrhea of left ear   2. Tobacco use disorder   3. Dysfunction of left eustachian tube   4. Conductive hearing loss, bilateral   5. Seasonal allergic rhinitis due to pollen    She seems to have intermittent recurrent problems with the left ear ongoing for several years with PE tube in place for several years. No antecedent event for the drainage, but I wonder if this is related to perhaps getting some water in ear or biofilm on tube.  Cholesteatoma is a possibility given intermittent drainage, but there is no physical exam evidence of it today.  In addition, she may also have a component of allergies given  congestion and mild turbinate hypertrophy.  She is not having frequent sinus infections and endoscopy today was negative.  Given her chronic issues, I do think that allergy testing would be beneficial in this case to see if we can better manage her eustachian tube dysfunction.   We reviewed the nasal endoscopy images together.  The risks, benefits and alternatives were discussed and questions answered.  She has elected to proceed with:  Given the patient's tobacco use, I also discussed cessation and options for cessation, including counseling. Counseled patient on the dangers of tobacco use, advised patient to stop smoking, and reviewed strategies to maximize success. Patient is not ready to quit, and declined further treatment. Total time spent with this was 4 minutes.   Send to allergy for testing Flonase and astelin  1) Start Ciprodex drops BID x14d left ear 2) Start flonase BID  3) Start astelin BID 4) Nasal  saline rinses 5) If persists, will get CT Temporal bones 6) Follow-up 1 month with audiogram to ensure hearing is stable 7) Will refer to allergy  See below regarding exact medications prescribed this encounter including dosages and route: Meds ordered this encounter  Medications   ciprofloxacin-dexamethasone (CIPRODEX) OTIC suspension    Sig: Place 4 drops into the left ear 2 (two) times daily for 14 days.    Dispense:  7.5 mL    Refill:  1   fluticasone (FLONASE) 50 MCG/ACT nasal spray    Sig: Place 2 sprays into both nostrils in the morning and at bedtime.    Dispense:  11 mL    Refill:  6   azelastine (ASTELIN) 0.1 % nasal spray    Sig: Place 2 sprays into both nostrils 2 (two) times daily. Use in each nostril as directed    Dispense:  30 mL    Refill:  12     Thank you for allowing me the opportunity to care for your patient. Please do not hesitate to contact me should you have any other questions.  Sincerely, Jovita Kussmaul, MD Otolarynoglogist (ENT), [facility] Phone: [phone] Fax: [phone]  MDM:  Level 4: [id number] Complexity/Problems addressed: multiple chronic problems, one with exacerbation Data complexity: mod - independent interpretation of notes, labs, independent audiogram review from outside - Morbidity: mod  - Prescription Drug prescribed or managed: yes  01/06/2023, 9:53 AM

## 2023-01-16 ENCOUNTER — Encounter (INDEPENDENT_AMBULATORY_CARE_PROVIDER_SITE_OTHER): Payer: Self-pay

## 2023-01-16 ENCOUNTER — Ambulatory Visit (INDEPENDENT_AMBULATORY_CARE_PROVIDER_SITE_OTHER): Payer: BLUE CROSS/BLUE SHIELD

## 2023-01-16 ENCOUNTER — Ambulatory Visit (INDEPENDENT_AMBULATORY_CARE_PROVIDER_SITE_OTHER): Payer: BLUE CROSS/BLUE SHIELD | Admitting: Audiology

## 2023-02-07 ENCOUNTER — Other Ambulatory Visit (HOSPITAL_COMMUNITY): Payer: Self-pay | Admitting: Nurse Practitioner

## 2023-02-07 DIAGNOSIS — Z1231 Encounter for screening mammogram for malignant neoplasm of breast: Secondary | ICD-10-CM

## 2023-02-09 ENCOUNTER — Ambulatory Visit (INDEPENDENT_AMBULATORY_CARE_PROVIDER_SITE_OTHER): Payer: BLUE CROSS/BLUE SHIELD | Admitting: Allergy & Immunology

## 2023-02-09 ENCOUNTER — Encounter: Payer: Self-pay | Admitting: Allergy & Immunology

## 2023-02-09 VITALS — BP 114/70 | HR 76 | Temp 98.4°F | Resp 14 | Ht 64.0 in | Wt 169.1 lb

## 2023-02-09 DIAGNOSIS — J310 Chronic rhinitis: Secondary | ICD-10-CM

## 2023-02-09 DIAGNOSIS — Z880 Allergy status to penicillin: Secondary | ICD-10-CM | POA: Diagnosis not present

## 2023-02-09 NOTE — Patient Instructions (Addendum)
1. Chronic rhinitis (Primary) - Because of insurance stipulations, we cannot do skin testing on the same day as your first visit. - We are all working to fight this, but for now we need to do two separate visits.  - We will know more after we do testing at the next visit.  - The skin testing visit can be squeezed in at your convenience.  - Then we can make a more full plan to address all of your symptoms. - Be sure to stop your antihistamines for 3 days before this appointment.    2. Penicillin allergy - Given the low risk history without anaphylaxis, I think we can skip the skin testing and go straight to a challenge.  - This is a 2 hour visit and we monitor your vitals and watch you closely during this time.   3. 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   4. Return in about 1 week (around 02/16/2023) for SKIN TESITNG (1-55) + FIRE ANT. You can have the follow up appointment with Dr. Dellis Anes or a Nurse Practicioner (our Nurse Practitioners are excellent and always have Physician oversight!).    Please inform us of any Emergency Department visits, hospitalizations, or changes in symptoms. Call us before going to the ED for breathing or allergy symptoms since we might be able to fit you in for a sick visit. Feel free to contact us anytime with any questions, problems, or concerns.  It was a pleasure to meet you today!  Websites that have reliable patient information: 1. American Academy of Asthma, Allergy, and Immunology: www.aaaai.org 2. Food Allergy Research and Education (FARE): foodallergy.org 3. Mothers of Asthmatics: http://www.asthmacommunitynetwork.org 4. American College of Allergy, Asthma, and Immunology: www.acaai.org      "Like" [place] on Facebook and Instagram for our latest updates!      A healthy democracy works best when [employer] participate! Make sure you are registered to vote! If you have moved or  changed any of your contact information, you will need to get this updated before voting! Scan the QR codes below to learn more!

## 2023-02-09 NOTE — Progress Notes (Unsigned)
NEW PATIENT  Date of Service/Encounter:  02/11/23  Consult requested by: Benita Stabile, MD   Assessment:   Chronic rhinitis - will plan for allergy testing at the next visit  Penicillin allergy - low risk history (therefore will plan for challenge)  Bactrim allergy - with immediate vomiting (unlikely to consider challenge)  Large local reaction to fire ants  Plan/Recommendations:   1. Chronic rhinitis - Because of insurance stipulations, we cannot do skin testing on the same day as your first visit. - We are all working to fight this, but for now we need to do two separate visits.  - We will know more after we do testing at the next visit.  - The skin testing visit can be squeezed in at your convenience.  - Then we can make a more full plan to address all of your symptoms. - Be sure to stop your antihistamines for 3 days before this appointment.    2. Penicillin allergy - Given the low risk history without anaphylaxis, I think we can skip the skin testing and go straight to a challenge.  - This is a 2 hour visit and we monitor your vitals and watch you closely during this time.   3. 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   4. Return in about 1 week (around 02/16/2023) for SKIN TESITNG (1-55) + FIRE ANT. You can have the follow up appointment with Dr. Dellis Anes or a Nurse Practicioner (our Nurse Practitioners are excellent and always have Physician oversight!).     This note in its entirety was forwarded to the Provider who requested this consultation.  Subjective:   Tenlee Wollin is a 58 y.o. female presenting today for evaluation of  Chief Complaint  Patient presents with   Allergic Rhinitis    Allergic Reaction    Amoxicillin    Jakiya Bookbinder has a history of the following: Patient Active Problem List   Diagnosis Date Noted   Encounter for screening colonoscopy 10/29/2019    History obtained from:  chart review and patient.  Discussed the use of AI scribe software for clinical note transcription with the patient and/or guardian, who gave verbal consent to proceed.  Karlie Aung was referred by Benita Stabile, MD.     Joyanna is a 58 y.o. female presenting for an evaluation of environmental allergies and a penicillin allergy . She was born in [place] and lived in [place] for a period of time. However she moved back once the relationship ended.   Asthma/Respiratory Symptom History: The patient is a current smoker. She has never been diagnosed with COPD at all and does not have an inhaler.   Allergic Rhinitis Symptom History: Nichol has history of ear infections and tube placement in adulthood and childhood due to high fevers. She mentions a history of frequent antibiotic use in childhood. She has a tube still present in the left side. She also mentions a recent episode of significant ear drainage, described as 'raining,' which was triggered by exposure to dust and bread while cooking without a mask. She does have some environmental allergy symptoms that are worse in the spring time. She has never been tested in the past.  Drug Allergy Symptom History: She has a known allergy to Bactrim, a sulfa drug, which caused severe vomiting and diarrhea on two occasions in [place]. She also expresses concern about a potential allergy to amoxicillin, although  the specific reaction is not clearly described. It seems from what she can remember, it was a head to toe rash. She is not sure how it was treated. Once of the main concerns today is the penicillin allergy, which should would like addressed. She is open to a challenge.   Stinging Insect Symptom History: She also reports a severe reaction to an insect bite, specifically fire ants, resulting in significant swelling. She never needed epinephrine, but she was on prednisone for this reaction. She has never had a reaction to Hymenoptera.   Otherwise, there is no  history of other atopic diseases, including asthma, food allergies, eczema, urticaria, or contact dermatitis. There is no significant infectious history. Vaccinations are up to date.    Past Medical History: Patient Active Problem List   Diagnosis Date Noted   Encounter for screening colonoscopy 10/29/2019    Medication List:  Allergies as of 02/09/2023       Reactions   Augmentin [amoxicillin-pot Clavulanate] Nausea And Vomiting   Was vomiting   Sulfamethoxazole Diarrhea, Nausea And Vomiting   Codeine Rash        Medication List        Accurate as of February 09, 2023 11:59 PM. If you have any questions, ask your nurse or doctor.          azelastine 0.1 % nasal spray Commonly known as: ASTELIN Place 2 sprays into both nostrils 2 (two) times daily. Use in each nostril as directed   buPROPion 150 MG 24 hr tablet Commonly known as: WELLBUTRIN XL Take 150 mg by mouth every morning.   cyclobenzaprine 10 MG tablet Commonly known as: FLEXERIL Take 1 tablet (10 mg total) by mouth 2 (two) times daily as needed for muscle spasms.   diazepam 5 MG tablet Commonly known as: VALIUM Take 5 mg by mouth daily as needed. For upcoming oral surgeon   fluticasone 50 MCG/ACT nasal spray Commonly known as: FLONASE Place 2 sprays into both nostrils in the morning and at bedtime.   levocetirizine 5 MG tablet Commonly known as: XYZAL Take 1 tablet by mouth at bedtime.        Birth History: non-contributory  Developmental History: non-contributory  Past Surgical History: Past Surgical History:  Procedure Laterality Date   BACK SURGERY     carpal tunnel repair     MOUTH SURGERY     TONSILLECTOMY AND ADENOIDECTOMY     TUBAL LIGATION     TYMPANOSTOMY TUBE PLACEMENT       Family History: Family History  Problem Relation Age of Onset   COPD Father    Psoriasis Maternal Uncle    Colon cancer Maternal Grandmother    Colon polyps Neg Hx      Social History: Gabryela  lives at home in a place that is [address] years old.  They have electric heating and cooling.  There are 2 [place] Shepherd in the home.  There are cats outside of the home.  There are no dust mite covers on the bedding.  There is tobacco exposure in the car.  She smokes a half a pack per day.  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   Review of systems otherwise negative other than that mentioned in the HPI.    Objective:   Blood pressure 114/70, pulse 76, temperature 98.4 F (36.9 C), resp. rate 14, height 5\' 4"  (1.626 m), weight 169 lb 2 oz (76.7 kg), SpO2 96%.  Body mass index is 29.03 kg/m.     Physical Exam Vitals reviewed.  Constitutional:      Appearance: She is well-developed.     Comments: Pleasant. Cooperative with the exam.   HENT:     Head: Normocephalic and atraumatic.     Right Ear: Tympanic membrane, ear canal and external ear normal. No drainage, swelling or tenderness. Tympanic membrane is not injected, scarred, erythematous, retracted or bulging.     Left Ear: Tympanic membrane, ear canal and external ear normal. No drainage, swelling or tenderness. Tympanic membrane is not injected, scarred, erythematous, retracted or bulging.     Nose: No nasal deformity, septal deviation, mucosal edema or rhinorrhea.     Right Turbinates: Enlarged, swollen and pale.     Left Turbinates: Enlarged, swollen and pale.     Right Sinus: No maxillary sinus tenderness or frontal sinus tenderness.     Left Sinus: No maxillary sinus tenderness or frontal sinus tenderness.     Mouth/Throat:     Mouth: Mucous membranes are not pale and not dry.     Pharynx: Uvula midline.  Eyes:     General: Lids are normal. Allergic shiner present.        Right eye: No discharge.        Left eye: No discharge.     Conjunctiva/sclera: Conjunctivae normal.     Right eye: Right conjunctiva is not injected. No chemosis.    Left eye: Left conjunctiva is not injected. No  chemosis.    Pupils: Pupils are equal, round, and reactive to light.  Cardiovascular:     Rate and Rhythm: Normal rate and regular rhythm.     Heart sounds: Normal heart sounds.  Pulmonary:     Effort: Pulmonary effort is normal. No tachypnea, accessory muscle usage or respiratory distress.     Breath sounds: Normal breath sounds. No wheezing, rhonchi or rales.     Comments: Moving air well in all lung fields. No increased work of breathing noted.  Chest:     Chest wall: No tenderness.  Abdominal:     Tenderness: There is no abdominal tenderness. There is no guarding or rebound.  Lymphadenopathy:     Head:     Right side of head: No submandibular, tonsillar or occipital adenopathy.     Left side of head: No submandibular, tonsillar or occipital adenopathy.     Cervical: No cervical adenopathy.  Skin:    Coloration: Skin is not pale.     Findings: No abrasion, erythema, petechiae or rash. Rash is not papular, urticarial or vesicular.  Neurological:     Mental Status: She is alert.  Psychiatric:        Behavior: Behavior is cooperative.      Diagnostic studies: deferred due to insurance stipulations that require a separate visit for testing         Malachi Bonds, MD [facility]

## 2023-02-11 ENCOUNTER — Encounter: Payer: Self-pay | Admitting: Allergy & Immunology

## 2023-02-12 ENCOUNTER — Other Ambulatory Visit (HOSPITAL_COMMUNITY): Payer: Self-pay | Admitting: Nurse Practitioner

## 2023-02-12 DIAGNOSIS — E782 Mixed hyperlipidemia: Secondary | ICD-10-CM

## 2023-02-16 ENCOUNTER — Ambulatory Visit (INDEPENDENT_AMBULATORY_CARE_PROVIDER_SITE_OTHER): Payer: BLUE CROSS/BLUE SHIELD | Admitting: Allergy & Immunology

## 2023-02-16 DIAGNOSIS — T63481D Toxic effect of venom of other arthropod, accidental (unintentional), subsequent encounter: Secondary | ICD-10-CM | POA: Diagnosis not present

## 2023-02-16 DIAGNOSIS — J3089 Other allergic rhinitis: Secondary | ICD-10-CM

## 2023-02-16 DIAGNOSIS — J302 Other seasonal allergic rhinitis: Secondary | ICD-10-CM | POA: Diagnosis not present

## 2023-02-16 MED ORDER — RYALTRIS 665-25 MCG/ACT NA SUSP: 2.0000 | Freq: Two times a day (BID) | NASAL | 5 refills | Status: AC | PRN

## 2023-02-16 MED ORDER — NEFFY 2 MG/0.1ML NA SOLN: 1.0000 | Freq: Two times a day (BID) | NASAL | 1 refills | Status: DC | PRN

## 2023-02-16 MED ORDER — LEVOCETIRIZINE DIHYDROCHLORIDE 5 MG PO TABS: 5.0000 mg | ORAL_TABLET | Freq: Every evening | ORAL | 1 refills | Status: AC

## 2023-02-16 NOTE — Progress Notes (Unsigned)
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 Chronic rhinitis (Primary) - Testing today showed: indoor molds, outdoor molds, dust mites, and cat - Copy of test results provided.  - Avoidance measures provided. - Stop taking: current medications - Start taking: Xyzal (levocetirizine) 5mg  tablet once daily and Ryaltris (olopatadine/mometasone) two sprays per nostril 1-2 times daily as needed - Sample of Ryaltris provided. - This contains a nasal antihistamine and a nasal steroid that can help with nasal allergies.  - This is sent to a mail order pharmacy called [facility], who will contact you about shipping.  - You can use an extra dose of the antihistamine, if needed, for breakthrough symptoms.  - Consider nasal saline rinses 1-2 times daily to remove allergens from the nasal cavities as well as help with mucous clearance (this is especially helpful to do before the nasal sprays are given) - Consider allergy shots as a means of long-term control. - Allergy shots "re-train" and "reset" the immune system to ignore environmental allergens and decrease the resulting immune response to those allergens (sneezing, itchy watery eyes, runny nose, nasal congestion, etc).    - Allergy shots improve symptoms in 75-85% of patients.  - We can discuss more at the next appointment if the medications are not working for you.  2. Penicillin allergy - Given the low risk history without anaphylaxis, I think we can skip the skin testing and go straight to a challenge.  - This is a 2 hour visit and we monitor your vitals and watch you closely during this time.  - Make this appointment on your way out, if you have not done so already.   3. 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 - Emergency Action provided. - Neffy training provided.  - This is delivered through a different mail order pharmacy.  - Consider fire ant venom immunotherapy.   4. Return in about 6 months (around 08/16/2023). You can have the follow up appointment with Dr. Dellis Anes or a Nurse Practicioner (our Nurse Practitioners are excellent and always have Physician oversight!).    Subjective:   Christine Schmitt is a 58 y.o. female presenting today for follow up of  Chief Complaint  Patient presents with   Allergy Testing    Environmental: 1-55 and Fire Any    Christine Schmitt has a history of the following: Patient Active Problem List   Diagnosis Date Noted   Encounter for screening colonoscopy 10/29/2019    History obtained from: chart review and patient.  Discussed the use of AI scribe software for clinical note transcription with the patient and/or guardian, who gave verbal consent to proceed.  [name] is a 58 y.o. female presenting for skin testing. She was last seen on January 24th. We could not do testing because her insurance company does not cover testing on the same day as a New Patient visit. She has been off of all antihistamines 3 days in anticipation of the testing.   Otherwise, there have been no changes to her past medical history, surgical history, family history, or social history.    Review of systems otherwise negative other than that mentioned in the HPI.    Objective:   There were no vitals taken for this visit. There is no height or  weight on file to calculate BMI.    Physical exam deferred since this was a skin testing appointment only.   Diagnostic studies:   Allergy Studies:     Airborne Adult Perc - 02/16/23 1423     Time Antigen Placed 1423    Allergen Manufacturer Waynette Buttery    Location Back    Number of Test 55    Panel 1 Select    1. Control-Buffer 50% Glycerol 3+   n   2. Control-Histamine Negative    3. Bahia Negative    4. French Southern Territ[place] Negative     5. Johnson Negative    6. [place] Blue Negative    7. Meadow Fescue Negative    8. Perennial Rye Negative    9. Timothy Negative    10. Ragweed Mix Negative    11. Cocklebur Negative    12. Plantain,  English Negative    13. Baccharis Negative    14. Dog Fennel Negative    15. Russian Thistle Negative    16. Lamb's Quarters Negative    17. Sheep Sorrell Negative    18. Rough Pigweed Negative    19. Marsh Elder, Rough Negative    20. Mugwort, Common Negative    21. Box, Elder Negative    22. Cedar, red Negative    23. Sweet Gum Negative    24. Pecan Pollen Negative    25. Pine Mix Negative    26. Walnut, Black Pollen Negative    27. Red Mulberry Negative    28. Ash Mix Negative    29. Birch Mix Negative    30. Beech American Negative    31. Cottonwood, Guinea-Bissau Negative    32. Hickory, White Negative    33. Maple Mix Negative    34. Oak, [place] Mix Negative    35. Sycamore Eastern Negative    36. Alternaria Alternata Negative    37. Cladosporium Herbarum Negative    38. Aspergillus Mix Negative    39. Penicillium Mix Negative    40. Bipolaris Sorokiniana (Helminthosporium) Negative    41. Drechslera Spicifera (Curvularia) Negative    42. Mucor Plumbeus Negative    43. Fusarium Moniliforme Negative    44. Aureobasidium Pullulans (pullulara) Negative    45. Rhizopus Oryzae Negative    46. Botrytis Cinera Negative    47. Epicoccum Nigrum Negative    48. Phoma Betae Negative    49. Dust Mite Mix Negative    50. Cat Hair 10,000 BAU/ml Negative    51.  Dog Epithelia Negative    52. Mixed Feathers Negative    53. Horse Epithelia Negative    54. Cockroach, German Negative    55. Tobacco Leaf Negative    1. Fire Ant 3+             Intradermal - 02/16/23 1518     Time Antigen Placed 1500    Allergen Manufacturer Waynette Buttery    Location Arm    Number of Test 16    Control Negative    Bahia Negative    French Southern Territories Negative    Johnson Negative    7 Grass Negative     Ragweed Mix Negative    Weed Mix Negative    Tree Mix Negative    Mold 1 2+    Mold 2 3+    Mold 3 3+    Mold 4 3+    Mite Mix 3+    Cat 3+    Dog Negative  Cockroach Negative             Allergy testing results were read and interpreted by myself, documented by clinical staff.      Malachi Bonds, MD  [facility]

## 2023-02-16 NOTE — Patient Instructions (Addendum)
1. Chronic rhinitis (Primary) - Testing today showed: indoor molds, outdoor molds, dust mites, and cat - Copy of test results provided.  - Avoidance measures provided. - Stop taking: current medications - Start taking: Xyzal (levocetirizine) 5mg  tablet once daily and Ryaltris (olopatadine/mometasone) two sprays per nostril 1-2 times daily as needed - Sample of Ryaltris provided. - This contains a nasal antihistamine and a nasal steroid that can help with nasal allergies.  - This is sent to a mail order pharmacy called [facility], who will contact you about shipping.  - You can use an extra dose of the antihistamine, if needed, for breakthrough symptoms.  - Consider nasal saline rinses 1-2 times daily to remove allergens from the nasal cavities as well as help with mucous clearance (this is especially helpful to do before the nasal sprays are given) - Consider allergy shots as a means of long-term control. - Allergy shots "re-train" and "reset" the immune system to ignore environmental allergens and decrease the resulting immune response to those allergens (sneezing, itchy watery eyes, runny nose, nasal congestion, etc).    - Allergy shots improve symptoms in 75-85% of patients.  - We can discuss more at the next appointment if the medications are not working for you.  2. Penicillin allergy - Given the low risk history without anaphylaxis, I think we can skip the skin testing and go straight to a challenge.  - This is a 2 hour visit and we monitor your vitals and watch you closely during this time.  - Make this appointment on your way out, if you have not done so already.   3. 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 - Emergency Action provided. - Neffy training provided.  - This is delivered through a different mail order pharmacy.  - Consider fire ant venom immunotherapy.   4. Return in about 6 months (around 08/16/2023). You can have the follow up  appointment with Dr. Dellis Anes or a Nurse Practicioner (our Nurse Practitioners are excellent and always have Physician oversight!).    Please inform us of any Emergency Department visits, hospitalizations, or changes in symptoms. Call us before going to the ED for breathing or allergy symptoms since we might be able to fit you in for a sick visit. Feel free to contact us anytime with any questions, problems, or concerns.  It was a pleasure to see you again today!  Websites that have reliable patient information: 1. American Academy of Asthma, Allergy, and Immunology: www.aaaai.org 2. Food Allergy Research and Education (FARE): foodallergy.org 3. Mothers of Asthmatics: http://www.asthmacommunitynetwork.org 4. American College of Allergy, Asthma, and Immunology: www.acaai.org      "Like" [place] on Facebook and Instagram for our latest updates!      A healthy democracy works best when [employer] participate! Make sure you are registered to vote! If you have moved or changed any of your contact information, you will need to get this updated before voting! Scan the QR codes below to learn more!       Airborne Adult Perc - 02/16/23 1423     Time Antigen Placed 1423    Allergen Manufacturer Waynette Buttery    Location Back    Number of Test 55    Panel 1 Select    1. Control-Buffer 50% Glycerol 3+   n   2. Control-Histamine Negative    3. Bahia Negative    4. French [place] Territ[place] Negative    5. Johnson Negative  6. [facility] Negative    7. Meadow Fescue Negative    8. Perennial Rye Negative    9. Timothy Negative    10. Ragweed Mix Negative    11. Cocklebur Negative    12. Plantain,  English Negative    13. Baccharis Negative    14. Dog Fennel Negative    15. Russian Thistle Negative    16. Lamb's Quarters Negative    17. Sheep Sorrell Negative    18. Rough Pigweed Negative    19. Marsh Elder, Rough Negative    20. Mugwort, Common Negative    21. Box, Elder Negative    22. Cedar, red  Negative    23. Sweet Gum Negative    24. Pecan Pollen Negative    25. Pine Mix Negative    26. Walnut, Black Pollen Negative    27. Red Mulberry Negative    28. Ash Mix Negative    29. Birch Mix Negative    30. Beech American Negative    31. Cottonwood, Guinea-Bissau Negative    32. Hickory, White Negative    33. Maple Mix Negative    34. Oak, Guinea-[place]iss[place] Mix Negative    35. Sycamore Eastern Negative    36. Alternaria Alternata Negative    37. Cladosporium Herbarum Negative    38. Aspergillus Mix Negative    39. Penicillium Mix Negative    40. Bipolaris Sorokiniana (Helminthosporium) Negative    41. Drechslera Spicifera (Curvularia) Negative    42. Mucor Plumbeus Negative    43. Fusarium Moniliforme Negative    44. Aureobasidium Pullulans (pullulara) Negative    45. Rhizopus Oryzae Negative    46. Botrytis Cinera Negative    47. Epicoccum Nigrum Negative    48. Phoma Betae Negative    49. Dust Mite Mix Negative    50. Cat Hair 10,000 BAU/ml Negative    51.  Dog Epithelia Negative    52. Mixed Feathers Negative    53. Horse Epithelia Negative    54. Cockroach, German Negative    55. Tobacco Leaf Negative    1. Fire Ant 3+             Intradermal - 02/16/23 1518     Time Antigen Placed 1500    Allergen Manufacturer Waynette Buttery    Location Arm    Number of Test 16    Control Negative    Bahia Negative    French Southern Territories Negative    Johnson Negative    7 Grass Negative    Ragweed Mix Negative    Weed Mix Negative    Tree Mix Negative    Mold 1 2+    Mold 2 3+    Mold 3 3+    Mold 4 3+    Mite Mix 3+    Cat 3+    Dog Negative    Cockroach Negative             Control of Mold Allergen   Mold and fungi can grow on a variety of surfaces provided certain temperature and moisture conditions exist.  Outdoor molds grow on plants, decaying vegetation and soil.  The major outdoor mold, Alternaria and Cladosporium, are found in very high numbers during hot and dry conditions.   Generally, a late Summer - Fall peak is seen for common outdoor fungal spores.  Rain will temporarily lower outdoor mold spore count, but counts rise rapidly when the rainy period ends.  The most important indoor molds are Aspergillus  and Penicillium.  Dark, humid and poorly ventilated basements are ideal sites for mold growth.  The next most common sites of mold growth are the bathroom and the kitchen.  Outdoor (Seasonal) Mold Control  Positive outdoor molds via skin testing: Alternaria, Cladosporium, Bipolaris (Helminthsporium), Drechslera (Curvalaria), and Mucor  Use air conditioning and keep windows closed Avoid exposure to decaying vegetation. Avoid leaf raking. Avoid grain handling. Consider wearing a face mask if working in moldy areas.    Indoor (Perennial) Mold Control   Positive indoor molds via skin testing: Aspergillus, Penicillium, Fusarium, Aureobasidium (Pullulara), and Rhizopus  Maintain humidity below 50%. Clean washable surfaces with 5% bleach solution. Remove sources e.g. contaminated carpets.    Control of Dust Mite Allergen    Dust mites play a major role in allergic asthma and rhinitis.  They occur in environments with high humidity wherever human skin is found.  Dust mites absorb humidity from the atmosphere (ie, they do not drink) and feed on organic matter (including shed human and animal skin).  Dust mites are a microscopic type of insect that you cannot see with the naked eye.  High levels of dust mites have been detected from mattresses, pillows, carpets, upholstered furniture, bed covers, clothes, soft toys and any woven material.  The principal allergen of the dust mite is found in its feces.  A gram of dust may contain 1,000 mites and 250,000 fecal particles.  Mite antigen is easily measured in the air during house cleaning activities.  Dust mites do not bite and do not cause harm to humans, other than by triggering allergies/asthma.    Ways to decrease  your exposure to dust mites in your home:  Encase mattresses, box springs and pillows with a mite-impermeable barrier or cover   Wash sheets, blankets and drapes weekly in hot water (130 F) with detergent and dry them in a dryer on the hot setting.  Have the room cleaned frequently with a vacuum cleaner and a damp dust-mop.  For carpeting or rugs, vacuuming with a vacuum cleaner equipped with a high-efficiency particulate air (HEPA) filter.  The dust mite allergic individual should not be in a room which is being cleaned and should wait 1 hour after cleaning before going into the room. Do not sleep on upholstered furniture (eg, couches).   If possible removing carpeting, upholstered furniture and drapery from the home is ideal.  Horizontal blinds should be eliminated in the rooms where the person spends the most time (bedroom, study, television room).  Washable vinyl, roller-type shades are optimal. Remove all non-washable stuffed toys from the bedroom.  Wash stuffed toys weekly like sheets and blankets above.   Reduce indoor humidity to less than 50%.  Inexpensive humidity monitors can be purchased at most hardware stores.  Do not use a humidifier as can make the problem worse and are not recommended.  Control of Dog or Cat Allergen  Avoidance is the best way to manage a dog or cat allergy. If you have a dog or cat and are allergic to dog or cats, consider removing the dog or cat from the home. If you have a dog or cat but don't want to find it a new home, or if your family wants a pet even though someone in the household is allergic, here are some strategies that may help keep symptoms at bay:  Keep the pet out of your bedroom and restrict it to only a few rooms. Be advised that keeping the dog or  cat in only one room will not limit the allergens to that room. Don't pet, hug or kiss the dog or cat; if you do, wash your hands with soap and water. High-efficiency particulate air (HEPA) cleaners run  continuously in a bedroom or living room can reduce allergen levels over time. Regular use of a high-efficiency vacuum cleaner or a central vacuum can reduce allergen levels. Giving your dog or cat a bath at least once a week can reduce airborne allergen.

## 2023-02-17 ENCOUNTER — Encounter: Payer: Self-pay | Admitting: Allergy & Immunology

## 2023-02-19 ENCOUNTER — Telehealth: Payer: Self-pay

## 2023-02-19 ENCOUNTER — Other Ambulatory Visit (HOSPITAL_COMMUNITY): Payer: Self-pay

## 2023-02-19 NOTE — Telephone Encounter (Signed)
Pharmacy Patient Advocate Encounter   Received notification from CoverMyMeds that prior authorization for Ryaltris 665-25MCG/ACT suspension is required/requested.   Insurance verification completed.   The patient is insured through [facility] .   Per test claim: Prior Authorization form/request asks a question that requires your assistance. Please see the question below and advise accordingly. The PA will not be submitted until the necessary information is received.

## 2023-02-20 ENCOUNTER — Telehealth: Payer: Self-pay | Admitting: Allergy & Immunology

## 2023-02-20 MED ORDER — NEFFY 2 MG/0.1ML NA SOLN: 1.0000 | Freq: Once | NASAL | 1 refills | Status: AC | PRN

## 2023-02-20 NOTE — Telephone Encounter (Addendum)
Patient's pharmacy called requesting verification on the directions for the Neffy that was sent in for the patient. The pharmacy states the directions for this medication is usually as needed for anaphylaxis.

## 2023-02-20 NOTE — Telephone Encounter (Signed)
Rx has been fixed from bid to once prn and resent in

## 2023-02-22 ENCOUNTER — Ambulatory Visit (HOSPITAL_COMMUNITY)
Admission: RE | Admit: 2023-02-22 | Discharge: 2023-02-22 | Disposition: A | Discharge status: Home / Self Care | Payer: BLUE CROSS/BLUE SHIELD | Source: Ambulatory Visit | Attending: Nurse Practitioner | Admitting: Nurse Practitioner

## 2023-02-22 ENCOUNTER — Encounter (HOSPITAL_COMMUNITY): Payer: Self-pay

## 2023-02-22 DIAGNOSIS — Z1231 Encounter for screening mammogram for malignant neoplasm of breast: Secondary | ICD-10-CM | POA: Insufficient documentation

## 2023-02-23 ENCOUNTER — Telehealth: Payer: Self-pay

## 2023-02-23 NOTE — Telephone Encounter (Signed)
 Can we call [facility]? They might just be sending the Ryaltris  for the $39 CashPay price?   Otherwise we could just send in Dymista. No problem at all.   Drexel Gentles, MD [facility] 

## 2023-02-23 NOTE — Telephone Encounter (Signed)
 Pharmacy Patient Advocate Encounter   Received notification from CoverMyMeds that prior authorization for Neffy  2MG /0.1ML solution is required/requested.   Insurance verification completed.   The patient is insured through [facility] .   Per test claim: PA required; PA submitted to above mentioned insurance via CoverMyMeds Key/confirmation #/EOC [id number] Status is pending

## 2023-02-23 NOTE — Telephone Encounter (Signed)
 Do you want us  to send in the dymista?

## 2023-02-23 NOTE — Telephone Encounter (Signed)
 Spoke with [facility], Ryaltris  has already been processed and shipped out.

## 2023-02-23 NOTE — Telephone Encounter (Signed)
 She has been on fluticasone . We could try Dymista. Although the Ryaltris  might just be sent from the specialty pharmacy.   Drexel Gentles, MD [facility] 

## 2023-02-26 NOTE — Telephone Encounter (Signed)
 Pharmacy Patient Advocate Encounter  Received notification from [facility] that Prior Authorization for Neffy 2MG /0. solution has been DENIED.  Full denial letter will be uploaded to the media tab. See denial reason below.  Your plan only covers this drug when you meet one of these options: A) You have tried other drugs your plan covers (preferred drugs), and they did not work well for you B) Your doctor gives us  a medical reason you cannot take those other drugs, or C) There is no other FDA ([place]  Food and Drug Administration) approved product for your condition. For your plan, you may need to try up to three preferred drugs. The preferred drugs for your plan are: epinephrine solution auto-injector 0.3mg /0.35ml (generic of Adrenaclick); epinephrine solution auto-injector 0.3mg /0.53ml (generics manufactured bu Teva/Mylan); EpiPen 2-Pak 0.3mg . Your doctor may need to get approval from your plan for preferred drugs. For this drug, you may have to meet other criteria.   PA #/Case ID/Reference #: [id number]

## 2023-02-28 NOTE — Telephone Encounter (Signed)
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   Malachi Bonds, MD [facility]

## 2023-02-28 NOTE — Telephone Encounter (Signed)
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

## 2023-03-01 ENCOUNTER — Telehealth: Payer: Self-pay

## 2023-03-01 NOTE — Telephone Encounter (Signed)
Pharmacy Patient Advocate Encounter   Received notification from Physician's Office that prior authorization for Neffy 2MG /0.1ML solution  is required/requested.   Insurance verification completed.   The patient is insured through [facility] .   Per test claim: PA required; PA submitted to above mentioned insurance via CoverMyMeds Key/confirmation #/EOC [id number] Status is pending   *added dx code of needle phobia

## 2023-03-01 NOTE — Telephone Encounter (Signed)
Resubmitting under new encounter!

## 2023-03-01 NOTE — Telephone Encounter (Signed)
Pharmacy Patient Advocate Encounter  Received notification from [facility] that Prior Authorization for [facility] 2MG /0.1ML solution  has been DENIED.  Full denial letter will be uploaded to the media tab. See denial reason below.   PA #/Case ID/Reference #:

## 2023-04-17 ENCOUNTER — Encounter: Payer: Self-pay | Admitting: *Deleted

## 2023-07-31 NOTE — Progress Notes (Unsigned)
  [address], [address] [place], [place] [address] [phone]  Audiological Evaluation    Name: Laray Rivkin     DOB:   June 30, 1965      MRN:   [id number]                                                                                     Service Date: 07/31/2023     Accompanied by: unaccompanied   Patient comes today after Dr. Tobie, ENT sent a referral for a hearing evaluation due to concerns with left ear otorrhea .   Symptoms Yes Details  Hearing loss  [x]  Left ear  Tinnitus  []    Ear pain/ infections/pressure  [x]  Left ear pain and drainage  Balance problems  []    Noise exposure history  []    Previous ear surgeries  [x]  Left T tube - 30+ years ago  Family history of hearing loss  []    Amplification  []    Other  []      Otoscopy: Right ear: Clear external ear canal and notable landmarks visualized on the tympanic membrane. Left ear:  Visibility affected due to drainage, moist external ear canal, unable to identify the T-Tube she has in that ear.  Tympanometry: Did not test to avoid cross contamination given left ear drainage.    Pure tone Audiometry: Right ear- Normal hearing from [phone],  except for a mild hearing loss from 125-250 and at 8000 Hz.    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  Speech Audiometry: Right ear- Speech Reception Threshold (SRT) was obtained at 15 dBHL. Left ear-Speech Reception Threshold (SRT) was obtained at 30 dBHL.   Word Recognition Score Tested using NU-6 (recorded) Right ear: 100% was obtained at a presentation level of 65 dBHL with contralateral masking which is deemed as  excellent. Left ear: 100% was obtained at a presentation level of 65 dBHL with contralateral masking which is deemed as  excellent.   The hearing test results were completed under headphones and results are deemed to be of good reliability. Test technique:  conventional      Recommendations: Follow up with ENT as  scheduled for today. Repeat audiogram after medical care.   Juanpablo Ciresi MARIE LEROUX-MARTINEZ, AUD

## 2023-08-01 ENCOUNTER — Encounter (INDEPENDENT_AMBULATORY_CARE_PROVIDER_SITE_OTHER): Payer: Self-pay | Admitting: Otolaryngology

## 2023-08-01 ENCOUNTER — Ambulatory Visit (INDEPENDENT_AMBULATORY_CARE_PROVIDER_SITE_OTHER): Admitting: Audiology

## 2023-08-01 ENCOUNTER — Ambulatory Visit (INDEPENDENT_AMBULATORY_CARE_PROVIDER_SITE_OTHER): Admitting: Otolaryngology

## 2023-08-01 VITALS — BP 122/77 | HR 70 | Ht 64.0 in | Wt 168.0 lb

## 2023-08-01 DIAGNOSIS — H6992 Unspecified Eustachian tube disorder, left ear: Secondary | ICD-10-CM

## 2023-08-01 DIAGNOSIS — H90A12 Conductive hearing loss, unilateral, left ear with restricted hearing on the contralateral side: Secondary | ICD-10-CM

## 2023-08-01 DIAGNOSIS — H9212 Otorrhea, left ear: Secondary | ICD-10-CM | POA: Diagnosis not present

## 2023-08-01 DIAGNOSIS — H9012 Conductive hearing loss, unilateral, left ear, with unrestricted hearing on the contralateral side: Secondary | ICD-10-CM

## 2023-08-01 DIAGNOSIS — H9191 Unspecified hearing loss, right ear: Secondary | ICD-10-CM | POA: Diagnosis not present

## 2023-08-01 DIAGNOSIS — F17210 Nicotine dependence, cigarettes, uncomplicated: Secondary | ICD-10-CM | POA: Diagnosis not present

## 2023-08-01 DIAGNOSIS — F17200 Nicotine dependence, unspecified, uncomplicated: Secondary | ICD-10-CM

## 2023-08-01 MED ORDER — CIPROFLOXACIN-DEXAMETHASONE 0.3-0.1 % OT SUSP: 4.0000 [drp] | Freq: Two times a day (BID) | OTIC | 1 refills | Status: AC

## 2023-08-01 NOTE — Progress Notes (Signed)
 Dear Dr. Shona, Here is my assessment for our mutual patient, Christine Schmitt. Thank you for allowing me the opportunity to care for your patient. Please do not hesitate to contact me should you have any other questions. Sincerely, Dr. Eldora Blanch  Otolaryngology Clinic Note  HISTORY: Christine Schmitt is a 58 y.o. female kindly referred by Dr. Shona for evaluation of chronic sinusitis.  Initial visit (10/2022): She was seen by [name] for her ears previously. She has had the tympanostomy tube since 1990 for ETD after several prior regular tympanostomy tubes. She reports she has had some left otorrhea - couple of days of bloody drainage, and and now draining some (yellow and brown ish) since middle of August (worse in summer). She was placed on Augmentin prior for this, but had vomiting so stopped. Discomfort every now and then, but no pain. Does have some postauricular pressure (comes and goes - maybe once a week, lasts about 20-30 minutes). No vertigo. Does have some left tinnitus, hearing has declined in left ear. Last audio was 2 years ago, no CT scan of sinuses. She had seen [name] for this prior, who noted patent T-tube  Did have multiple ear infections as a child and in the past, no barotrauma, or vestibular suppressant use or ototoxic meds Some noise exposure (works in [employer], gun noise exposure as child)  She also reports chronic sinus issues. No sinus pressure or pain, no discolored drainage from nose, no frequent sinus infections requiring abx and steroids. She does report some allergies (itchy eyes/nose), nasal congestion and PND. No prior allergy  testing  No CT Head or Sinuses  No previous sinonasal surgery.  She is currently using no nasal medications; she has not used flonase , astelin , afrin or other nasal medications She has tried xyzal  before.  --------------------------------------------------------- 08/01/2023 Reports that after the ciprodex , her ear drainage had  completely resolved. Re-started around may after mother's day. No pain. She has some tinnitus intermittently on left, some muffling in the ear (under water). No mastoid pain. She is keeping the ear dry mostly, but she reports that possibly allergies or getting water in the ear that precipitated her symptoms;. Still not having issues from nasal standpoint, and she is using flonase  regularly. She did have a hearing test today. She did see Dr. Iva with testing showing allergies to mold primarily  GLP-1: no AP/AC: no  PMHx: Anxiety  H&N Surgery: multiple BTT (adult) and T&A  She does smoke; 35 pack year history  RADIOGRAPHIC EVALUATION AND INDEPENDENT REVIEW OF OTHER RECORDS:: Referral notes Christine Schmitt - 11/10/2022) - Sinus issues for a while and noted to have otorrhea; Rx augmentin, ref to ENT Dr. Inez notes  08/2020 and 09/23/2020: L otorrhea, h/o T-tube in 1989. Otorrhea since Nov 2021, discolored draiange and occassional pain; 2 ep vertigo. Left hearing loss, and tinnitus. Noted T-tube with otorrhea and granuloma noted and tube partially obstructed with granulation; TM thickened; Rx ciprodex . Noted improvement after ciprodex ; los tto follow up it appears; no TFL noted Audio 2022 independently reviewed done at [facility]: large vol left, and symmetric HL :   Dr. 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% at 65dB; left same but conductive HL AS with  ABG ~20-30dB in mid-high frequencies; WRT 100% at 65dB; tymps DNT   SNHL= Sensorineural hearing loss   Past Medical History:  Diagnosis Date   Pre-diabetes    Seasonal allergies    Past Surgical History:  Procedure  Laterality Date   BACK SURGERY     carpal tunnel repair     MOUTH SURGERY     TONSILLECTOMY AND ADENOIDECTOMY     TUBAL LIGATION     TYMPANOSTOMY TUBE PLACEMENT     Family History  Problem Relation Age of Onset   COPD Father    Psoriasis Maternal Uncle    Colon cancer Maternal Grandmother    Colon polyps Neg Hx    Social History   Tobacco Use   Smoking status: Every Day   Smokeless tobacco: Never   Tobacco comments:    down to 0.5 packs per day  Substance Use Topics   Alcohol use: Not Currently    Comment: none in 4 years   Allergies  Allergen Reactions   Augmentin [Amoxicillin-Pot Clavulanate] Nausea And Vomiting    Was vomiting   Sulfamethoxazole Diarrhea and Nausea And Vomiting   Codeine Rash   Current Outpatient Medications  Medication Sig Dispense Refill   azelastine  (ASTELIN ) 0.1 % nasal spray Place 2 sprays into both nostrils 2 (two) times daily. Use in each nostril as directed 30 mL 12   buPROPion (WELLBUTRIN XL) 150 MG 24 hr tablet Take 150 mg by mouth every morning.     ciprofloxacin -dexamethasone  (CIPRODEX ) OTIC suspension Place 4 drops into the left ear 2 (two) times daily for 14 days. 7.5 mL 1   cyclobenzaprine  (FLEXERIL ) 10 MG tablet Take 1 tablet (10 mg total) by mouth 2 (two) times daily as needed for muscle spasms. 20 tablet 0   diazepam (VALIUM) 5 MG tablet Take 5 mg by mouth daily as needed. For upcoming oral surgeon     EPINEPHrine  (NEFFY ) 2 MG/0.1ML SOLN Place 1 spray into the nose once as needed for up to 1 dose. 4 each 1   fluticasone  (FLONASE ) 50 MCG/ACT nasal spray Place 2 sprays into both nostrils in the morning and at bedtime. 11 mL 6   levocetirizine (XYZAL ) 5 MG tablet Take 1 tablet by mouth at bedtime.     levocetirizine (XYZAL ) 5 MG tablet Take 1 tablet (5 mg total) by mouth every evening. 90 tablet 1   Olopatadine-Mometasone (RYALTRIS ) 665-25 MCG/ACT SUSP Place 2 sprays into the nose 2 (two) times daily as needed. 29 g 5   No current  facility-administered medications for this visit.   BP 122/77 (BP Location: Right Arm, Patient Position: Sitting, Cuff Size: Normal)   Pulse 70   Ht 5' 4 (1.626 m)   Wt 168 lb (76.2 kg)   SpO2 94%   BMI 28.84 kg/m   PHYSICAL EXAM:  BP 122/77 (BP Location: Right Arm, Patient Position: Sitting, Cuff Size: Normal)   Pulse 70   Ht 5' 4 (1.626 m)   Wt 168 lb (76.2 kg)   SpO2 94%   BMI 28.84 kg/m    Salient findings:  CN II-XII intact Left EAC: clear; TM with T-tube in place, patent with some granulation surrounding and continued discolored drainage; unable to appreciate frank cholesteatoma or granulation Right: EAC clear and TM intact with well pneumatized middle ear spaces Weber 512: left Rinne 512: AC > BC b/l  Nose: Anterior rhinoscopy reveals mild nasal septal deviation and bilateral inferior turbinate hypertrophy. No lesions of oral cavity/oropharynx No  respiratory distress or stridor   PROCEDURE: Diagnostic Nasal Endoscopy Pre-procedure diagnosis: Concern for chronic sinusitis or structural lesion; unilateral eustachian tube dysfunction Post-procedure diagnosis: same Prior, not today  Description of Procedure:  Patient was identified. A rigid 30 degree endoscope was utilized to evaluate the sinonasal cavities, mucosa, sinus ostia and turbinates and septum.  Overall, mild signs of mucosal inflammation are noted.  No mucopurulence, polyps, or masses noted.   Right Middle meatus: clear Right SE Recess: clear Left MM: clear Left SE Recess: clear No masses over eustachian tube; evidence of prior adenoidectomy      ASSESSMENT:  Ms. Frater is a 58 y.o. F with:  1. Otorrhea of left ear   2. 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 Dysfunction of left eustachian tube   4. Tobacco use disorder    She seems to have intermittent recurrent problems with the left ear ongoing for several years with PE tube in place for several years. No  antecedent event for the drainage, but I wonder if this is related to perhaps getting some water in ear or biofilm on tube.  Cholesteatoma is a possibility given intermittent drainage, but there is no physical exam evidence of it today again.    Given persistent drainage and CHL, will start with ciprodex  drops again prior benefit and see her back in 4 weeks. Continue flonase  and navage; f/u in 1 month - Will get CT after  See below regarding exact medications prescribed this encounter including dosages and route: Meds ordered this encounter  Medications   ciprofloxacin -dexamethasone  (CIPRODEX ) OTIC suspension    Sig: Place 4 drops into the left ear 2 (two) times daily for 14 days.    Dispense:  7.5 mL    Refill:  1     Thank you for allowing me the opportunity to care for your patient. Please do not hesitate to contact me should you have any other questions.  Sincerely, Eldora Blanch, MD Otolaryngologist (ENT), [facility] Phone: [phone] Fax: [phone]  MDM:  Level 4: 99214 Complexity/Problems addressed: multiple chronic problems, one with exacerbation Data complexity: low - review of notes, tests - Morbidity: mod  - Prescription Drug prescribed or managed: yes  08/12/2023, 5:51 PM

## 2023-08-24 ENCOUNTER — Ambulatory Visit: Payer: BLUE CROSS/BLUE SHIELD | Admitting: Allergy & Immunology

## 2023-09-05 ENCOUNTER — Encounter (INDEPENDENT_AMBULATORY_CARE_PROVIDER_SITE_OTHER): Payer: Self-pay | Admitting: Otolaryngology

## 2023-09-05 ENCOUNTER — Ambulatory Visit (INDEPENDENT_AMBULATORY_CARE_PROVIDER_SITE_OTHER): Admitting: Otolaryngology

## 2023-09-05 VITALS — BP 125/66 | HR 70 | Ht 64.0 in | Wt 168.0 lb

## 2023-09-05 DIAGNOSIS — F17210 Nicotine dependence, cigarettes, uncomplicated: Secondary | ICD-10-CM

## 2023-09-05 DIAGNOSIS — H90A12 Conductive hearing loss, unilateral, left ear with restricted hearing on the contralateral side: Secondary | ICD-10-CM

## 2023-09-05 DIAGNOSIS — H7012 Chronic mastoiditis, left ear: Secondary | ICD-10-CM | POA: Diagnosis not present

## 2023-09-05 DIAGNOSIS — F17200 Nicotine dependence, unspecified, uncomplicated: Secondary | ICD-10-CM

## 2023-09-05 DIAGNOSIS — H6122 Impacted cerumen, left ear: Secondary | ICD-10-CM

## 2023-09-05 DIAGNOSIS — H9212 Otorrhea, left ear: Secondary | ICD-10-CM

## 2023-09-05 DIAGNOSIS — H6612 Chronic tubotympanic suppurative otitis media, left ear: Secondary | ICD-10-CM

## 2023-09-05 DIAGNOSIS — H6992 Unspecified Eustachian tube disorder, left ear: Secondary | ICD-10-CM | POA: Diagnosis not present

## 2023-09-05 MED ORDER — CIPROFLOXACIN-DEXAMETHASONE 0.3-0.1 % OT SUSP: 4.0000 [drp] | Freq: Two times a day (BID) | OTIC | 1 refills | Status: AC

## 2023-09-05 NOTE — Progress Notes (Signed)
 Dear Dr. Shona, Here is my assessment for our mutual patient, Christine Schmitt. Thank you for allowing me the opportunity to care for your patient. Please do not hesitate to contact me should you have any other questions. Sincerely, Dr. Eldora Blanch  Otolaryngology Clinic Note  HISTORY: Christine Schmitt is a 58 y.o. female kindly referred by Dr. Shona for evaluation of chronic sinusitis.  Initial visit (10/2022): She was seen by [name] for her ears previously. She has had the tympanostomy tube since 1990 for ETD after several prior regular tympanostomy tubes. She reports she has had some left otorrhea - couple of days of bloody drainage, and and now draining some (yellow and brown ish) since middle of August (worse in summer). She was placed on Augmentin prior for this, but had vomiting so stopped. Discomfort every now and then, but no pain. Does have some postauricular pressure (comes and goes - maybe once a week, lasts about 20-30 minutes). No vertigo. Does have some left tinnitus, hearing has declined in left ear. Last audio was 2 years ago, no CT scan of sinuses. She had seen [name] for this prior, who noted patent T-tube  Did have multiple ear infections as a child and in the past, no barotrauma, or vestibular suppressant use or ototoxic meds Some noise exposure (works in [employer], gun noise exposure as child)  She also reports chronic sinus issues. No sinus pressure or pain, no discolored drainage from nose, no frequent sinus infections requiring abx and steroids. She does report some allergies (itchy eyes/nose), nasal congestion and PND. No prior allergy  testing  No CT Head or Sinuses  No previous sinonasal surgery.  She is currently using no nasal medications; she has not used flonase , astelin , afrin or other nasal medications She has tried xyzal  before.  --------------------------------------------------------- 08/01/2023 Reports that after the ciprodex , her ear drainage had  completely resolved. Re-started around may after mother's day. No pain. She has some tinnitus intermittently on left, some muffling in the ear (under water). No mastoid pain. She is keeping the ear dry mostly, but she reports that possibly allergies or getting water in the ear that precipitated her symptoms;. Still not having issues from nasal standpoint, and she is using flonase  regularly. She did have a hearing test today. She did see Dr. Iva with testing showing allergies to mold primarily  .--------------------------------------------------------- 09/05/2023 Seen in follow up. Dad was in [facility] so has not taken optimal care of her ears. Left ear continues to drain some, no pain but mild postauricular pressure. Ear feels muffled. Flonase  use is PRN.   GLP-1: no AP/AC: no  PMHx: Anxiety  H&N Surgery: multiple BTT (adult) and T&A  She does smoke; 35 pack year history  RADIOGRAPHIC EVALUATION AND INDEPENDENT REVIEW OF OTHER RECORDS:: Referral notes [name] - 11/10/2022) - Sinus issues for a while and noted to have otorrhea; Rx augmentin, ref to ENT Dr. Inez notes  08/2020 and 09/23/2020: L otorrhea, h/o T-tube in 1989. Otorrhea since Nov 2021, discolored draiange and occassional pain; 2 ep vertigo. Left hearing loss, and tinnitus. Noted T-tube with otorrhea and granuloma noted and tube partially obstructed with granulation; TM thickened; Rx ciprodex . Noted improvement after ciprodex ; los tto follow up it appears; no TFL noted Audio 2022 independently reviewed done at [facility]: large vol left, and symmetric HL :   Dr. Iva (Allergy ) 02/09/2023 - noted chronic rhinitis, h/o ear infections; Dx: Rhinitis; Rx: skin testing - afterwards, started on xyzal , ryaltris  Skin testing 02/16/2023 reviewed: +  to mod, cat, mites 07/2023 Audiogram was independently reviewed and interpreted by me and it reveals - mild upsloping to normal then downsloping to mild SNHL AD with WRT 100% at 65dB; left  same but conductive HL AS with ABG ~20-30dB in mid-high frequencies; WRT 100% at 65dB; tymps DNT   SNHL= Sensorineural hearing loss   Past Medical History:  Diagnosis Date   Pre-diabetes    Seasonal allergies    Past Surgical History:  Procedure Laterality Date   BACK SURGERY     carpal tunnel repair     MOUTH SURGERY     TONSILLECTOMY AND ADENOIDECTOMY     TUBAL LIGATION     TYMPANOSTOMY TUBE PLACEMENT     Family History  Problem Relation Age of Onset   COPD Father    Psoriasis Maternal Uncle    Colon cancer Maternal Grandmother    Colon polyps Neg Hx    Social History   Tobacco Use   Smoking status: Every Day   Smokeless tobacco: Never   Tobacco comments:    down to 0.5 packs per day  Substance Use Topics   Alcohol use: Not Currently    Comment: none in 4 years   Allergies  Allergen Reactions   Augmentin [Amoxicillin-Pot Clavulanate] Nausea And Vomiting    Was vomiting   Sulfamethoxazole Diarrhea and Nausea And Vomiting   Codeine Rash   Current Outpatient Medications  Medication Sig Dispense Refill   azelastine  (ASTELIN ) 0.1 % nasal spray Place 2 sprays into both nostrils 2 (two) times daily. Use in each nostril as directed 30 mL 12   buPROPion (WELLBUTRIN XL) 150 MG 24 hr tablet Take 150 mg by mouth every morning.     ciprofloxacin -dexamethasone  (CIPRODEX ) OTIC suspension Place 4 drops into the left ear 2 (two) times daily for 14 days. 7.5 mL 1   cyclobenzaprine  (FLEXERIL ) 10 MG tablet Take 1 tablet (10 mg total) by mouth 2 (two) times daily as needed for muscle spasms. 20 tablet 0   diazepam (VALIUM) 5 MG tablet Take 5 mg by mouth daily as needed. For upcoming oral surgeon     EPINEPHrine  (NEFFY ) 2 MG/0.1ML SOLN Place 1 spray into the nose once as needed for up to 1 dose. 4 each 1   fluticasone  (FLONASE ) 50 MCG/ACT nasal spray Place 2 sprays into both nostrils in the morning and at bedtime. 11 mL 6   levocetirizine (XYZAL ) 5 MG tablet Take 1 tablet by mouth  at bedtime.     levocetirizine (XYZAL ) 5 MG tablet Take 1 tablet (5 mg total) by mouth every evening. 90 tablet 1   Olopatadine-Mometasone (RYALTRIS ) 665-25 MCG/ACT SUSP Place 2 sprays into the nose 2 (two) times daily as needed. 29 g 5   No current facility-administered medications for this visit.   BP 125/66 (BP Location: Right Arm, Patient Position: Sitting, Cuff Size: Large)   Pulse 70   Ht 5' 4 (1.626 m)   Wt 168 lb (76.2 kg)   SpO2 95%   BMI 28.84 kg/m   PHYSICAL EXAM:  BP 125/66 (BP Location: Right Arm, Patient Position: Sitting, Cuff Size: Large)   Pulse 70   Ht 5' 4 (1.626 m)   Wt 168 lb (76.2 kg)   SpO2 95%   BMI 28.84 kg/m    Salient findings:  CN II-XII intact Left EAC: clear; TM with T-tube in place, patent but no granulation surrounding; noted impaction with thick mucoid/slight mucopurulent debris; Given history and complaints, ear microscopy  was indicated and performed for evaluation with findings as below in physical exam section and in procedures; after clearance of impacted debirs; unable to appreciate frank cholesteatoma or granulation with patent T-tube. Right: EAC clear and TM intact with well pneumatized middle ear spaces Weber 512: left Rinne 512: AC > BC b/l  Nose: Anterior rhinoscopy reveals mild nasal septal deviation and bilateral inferior turbinate hypertrophy. No lesions of oral cavity/oropharynx No respiratory distress or stridor   Procedure: Bilateral ear microscopy and ceruminous debris removal using microscope (CPT [phone]) - Mod 25 Pre-procedure diagnosis: Ceruminous debris impaction left external ears Post-procedure diagnosis: same Indication: Left ceruminous debris impaction; given patient's otologic complaints and history as well as for improved and comprehensive examination of external ear and tympanic membrane, bilateral otologic examination using microscope was performed and impacted cerumen removed  Procedure: Patient was placed  semi-recumbent. Verbal consent was obtained. Both ear canals were examined using the microscope with findings above. There was thick mucous and slight purulence filling EAC with some debris and it was impacted prevent any view of TM; this was suctioned with findings above; CSF powder was applied to TM and EAC Patient tolerated the procedure well.    ASSESSMENT:  Ms. Bunker is a 58 y.o. F with:  1. 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 Otorrhea of left ear   4. Dysfunction of left eustachian tube   5. Chronic mastoiditis of left side   6. Impacted cerumen of left ear   7. Tobacco use disorder    She seems to have intermittent recurrent problems with the left ear ongoing for several years with PE tube in place for several years. No antecedent event for the drainage, but now chronic despite drop use. Cholesteatoma is a possibility given intermittent drainage, but there is no physical exam evidence of it today again.   We discussed given chronicity, there is likely component of chronic mastoiditis/chronic OM - perhaps biofilm.  Will treat her and then get CT  Ciprodex  drops BID x14d CT Temporal bones Restart flonase  and navage F/u 6 weeks (she opted for this given recent discharge for her dad who she helps provide care for)  See below regarding exact medications prescribed this encounter including dosages and route: Meds ordered this encounter  Medications   ciprofloxacin -dexamethasone  (CIPRODEX ) OTIC suspension    Sig: Place 4 drops into the left ear 2 (two) times daily for 14 days.    Dispense:  7.5 mL    Refill:  1     Thank you for allowing me the opportunity to care for your patient. Please do not hesitate to contact me should you have any other questions.  Sincerely, Eldora Blanch, MD Otolaryngologist (ENT), [facility] Phone: [phone] Fax:  [phone]  MDM:  Level 4: 99214 Complexity/Problems addressed: multiple chronic problems, one with exacerbation Data complexity: low - review of notes, tests - Morbidity: mod  - Prescription Drug prescribed or managed: yes  09/05/2023, 9:59 AM

## 2023-09-05 NOTE — Patient Instructions (Signed)
 I have ordered an imaging study for you to complete prior to your next visit. Please call [facility] Scheduling at [phone] to schedule your imaging if you have not received a call within 24 hours. If you are unable to complete your imaging study prior to your next scheduled visit please call our office to let us  know.

## 2023-10-08 ENCOUNTER — Ambulatory Visit (HOSPITAL_COMMUNITY)
Admission: RE | Admit: 2023-10-08 | Discharge: 2023-10-08 | Disposition: A | Discharge status: Home / Self Care | Source: Ambulatory Visit | Attending: Otolaryngology | Admitting: Otolaryngology

## 2023-10-08 DIAGNOSIS — F17200 Nicotine dependence, unspecified, uncomplicated: Secondary | ICD-10-CM | POA: Diagnosis present

## 2023-10-08 DIAGNOSIS — H6612 Chronic tubotympanic suppurative otitis media, left ear: Secondary | ICD-10-CM | POA: Insufficient documentation

## 2023-10-08 DIAGNOSIS — H7012 Chronic mastoiditis, left ear: Secondary | ICD-10-CM | POA: Insufficient documentation

## 2023-10-08 DIAGNOSIS — H6992 Unspecified Eustachian tube disorder, left ear: Secondary | ICD-10-CM | POA: Insufficient documentation

## 2023-10-08 DIAGNOSIS — H9212 Otorrhea, left ear: Secondary | ICD-10-CM | POA: Diagnosis present

## 2023-10-08 DIAGNOSIS — H90A12 Conductive hearing loss, unilateral, left ear with restricted hearing on the contralateral side: Secondary | ICD-10-CM | POA: Diagnosis present

## 2023-10-31 ENCOUNTER — Ambulatory Visit: Payer: Self-pay | Admitting: Allergy & Immunology

## 2023-10-31 DIAGNOSIS — J309 Allergic rhinitis, unspecified: Secondary | ICD-10-CM

## 2023-11-01 ENCOUNTER — Encounter (INDEPENDENT_AMBULATORY_CARE_PROVIDER_SITE_OTHER): Payer: Self-pay | Admitting: Otolaryngology

## 2023-11-01 ENCOUNTER — Ambulatory Visit (INDEPENDENT_AMBULATORY_CARE_PROVIDER_SITE_OTHER): Admitting: Otolaryngology

## 2023-11-01 VITALS — BP 112/59 | HR 63 | Ht 64.0 in

## 2023-11-01 DIAGNOSIS — H6612 Chronic tubotympanic suppurative otitis media, left ear: Secondary | ICD-10-CM | POA: Diagnosis not present

## 2023-11-01 DIAGNOSIS — H7012 Chronic mastoiditis, left ear: Secondary | ICD-10-CM

## 2023-11-01 DIAGNOSIS — H6992 Unspecified Eustachian tube disorder, left ear: Secondary | ICD-10-CM | POA: Diagnosis not present

## 2023-11-01 DIAGNOSIS — F17200 Nicotine dependence, unspecified, uncomplicated: Secondary | ICD-10-CM

## 2023-11-01 DIAGNOSIS — H9212 Otorrhea, left ear: Secondary | ICD-10-CM

## 2023-11-01 DIAGNOSIS — H90A12 Conductive hearing loss, unilateral, left ear with restricted hearing on the contralateral side: Secondary | ICD-10-CM

## 2023-11-01 DIAGNOSIS — F17210 Nicotine dependence, cigarettes, uncomplicated: Secondary | ICD-10-CM

## 2023-11-01 DIAGNOSIS — J301 Allergic rhinitis due to pollen: Secondary | ICD-10-CM

## 2023-11-01 MED ORDER — CIPROFLOXACIN-DEXAMETHASONE 0.3-0.1 % OT SUSP: 4.0000 [drp] | Freq: Two times a day (BID) | OTIC | 0 refills | Status: AC

## 2023-11-01 NOTE — Progress Notes (Signed)
 Dear Dr. Shona, Here is my assessment for our mutual patient, Christine Schmitt. Thank you for allowing me the opportunity to care for your patient. Please do not hesitate to contact me should you have any other questions. Sincerely, Dr. Eldora Blanch  Otolaryngology Clinic Note  HISTORY: Christine Schmitt is a 58 y.o. female kindly referred by Dr. Shona for evaluation of chronic sinusitis.  Initial visit (10/2022): She was seen by [name] for her ears previously. She has had the tympanostomy tube since 1990 for ETD after several prior regular tympanostomy tubes. She reports she has had some left otorrhea - couple of days of bloody drainage, and and now draining some (yellow and brown ish) since middle of August (worse in summer). She was placed on Augmentin prior for this, but had vomiting so stopped. Discomfort every now and then, but no pain. Does have some postauricular pressure (comes and goes - maybe once a week, lasts about 20-30 minutes). No vertigo. Does have some left tinnitus, hearing has declined in left ear. Last audio was 2 years ago, no CT scan of sinuses. She had seen [name] for this prior, who noted patent T-tube  Did have multiple ear infections as a child and in the past, no barotrauma, or vestibular suppressant use or ototoxic meds Some noise exposure (works in [employer], gun noise exposure as child)  She also reports chronic sinus issues. No sinus pressure or pain, no discolored drainage from nose, no frequent sinus infections requiring abx and steroids. She does report some allergies (itchy eyes/nose), nasal congestion and PND. No prior allergy  testing  No CT Head or Sinuses  No previous sinonasal surgery.  She is currently using no nasal medications; she has not used flonase , astelin , afrin or other nasal medications She has tried xyzal  before.  --------------------------------------------------------- 08/01/2023 Reports that after the ciprodex , her ear drainage had  completely resolved. Re-started around may after mother's day. No pain. She has some tinnitus intermittently on left, some muffling in the ear (under water). No mastoid pain. She is keeping the ear dry mostly, but she reports that possibly allergies or getting water in the ear that precipitated her symptoms;. Still not having issues from nasal standpoint, and she is using flonase  regularly. She did have a hearing test today. She did see Dr. Iva with testing showing allergies to mold primarily  .--------------------------------------------------------- 09/05/2023 Seen in follow up. Dad was in [place] so has not taken optimal care of her ears. Left ear continues to drain some, no pain but mild postauricular pressure. Ear feels muffled. Flonase  use is PRN.  --------------------------------------------------------- 11/01/2023 From sinus standpoint, doing well. No problems with allergies this fall. Using Navage and flonase . Left ear continues to drain. The drops helped, but now drainage has resumed. Still with intermittent muffled hearing. No pain. She did have a CT which I reviewed   GLP-1: no AP/AC: no  PMHx: Anxiety  H&N Surgery: multiple BTT (adult) and T&A  She does smoke; 35 pack year history  RADIOGRAPHIC EVALUATION AND INDEPENDENT REVIEW OF OTHER RECORDS:: Referral notes [name] - 11/10/2022) - Sinus issues for a while and noted to have otorrhea; Rx augmentin, ref to ENT Dr. Inez notes  08/2020 and 09/23/2020: L otorrhea, h/o T-tube in 1989. Otorrhea since Nov 2021, discolored draiange and occassional pain; 2 ep vertigo. Left hearing loss, and tinnitus. Noted T-tube with otorrhea and granuloma noted and tube partially obstructed with granulation; TM thickened; Rx ciprodex . Noted improvement after ciprodex ; los tto follow up it appears;  no TFL noted Audio 2022 independently reviewed done at [facility]: large vol left, and symmetric HL :   Dr. Iva (Allergy ) 02/09/2023 - noted  chronic rhinitis, h/o ear infections; Dx: Rhinitis; Rx: skin testing - afterwards, started on xyzal , ryaltris  Skin testing 02/16/2023 reviewed: + to mod, cat, mites 07/2023 Audiogram was independently reviewed and interpreted by me and it reveals - mild upsloping to normal then downsloping to mild SNHL AD with WRT 100% at 65dB; left same but conductive HL AS with ABG ~20-30dB in mid-high frequencies; WRT 100% at 65dB; tymps DNT   SNHL= Sensorineural hearing loss  CT Temporal bones independently interpreted (10/08/2023): evidence of left chronic-otomastoiditis; ossicular chain in continuity; tegmen thin  Past Medical History:  Diagnosis Date   Pre-diabetes    Seasonal allergies    Past Surgical History:  Procedure Laterality Date   BACK SURGERY     carpal tunnel repair     MOUTH SURGERY     TONSILLECTOMY AND ADENOIDECTOMY     TUBAL LIGATION     TYMPANOSTOMY TUBE PLACEMENT     Family History  Problem Relation Age of Onset   COPD Father    Psoriasis Maternal Uncle    Colon cancer Maternal Grandmother    Colon polyps Neg Hx    Social History   Tobacco Use   Smoking status: Every Day   Smokeless tobacco: Never   Tobacco comments:    down to 0.5 packs per day  Substance Use Topics   Alcohol use: Not Currently    Comment: none in 4 years   Allergies  Allergen Reactions   Augmentin [Amoxicillin-Pot Clavulanate] Nausea And Vomiting    Was vomiting   Sulfamethoxazole Diarrhea and Nausea And Vomiting   Codeine Rash   Current Outpatient Medications  Medication Sig Dispense Refill   azelastine  (ASTELIN ) 0.1 % nasal spray Place 2 sprays into both nostrils 2 (two) times daily. Use in each nostril as directed 30 mL 12   buPROPion (WELLBUTRIN XL) 150 MG 24 hr tablet Take 150 mg by mouth every morning.     ciprofloxacin -dexamethasone  (CIPRODEX ) OTIC suspension Place 4 drops into the left ear 2 (two) times daily for 14 days. 7.5 mL 0   cyclobenzaprine  (FLEXERIL ) 10 MG tablet Take 1  tablet (10 mg total) by mouth 2 (two) times daily as needed for muscle spasms. 20 tablet 0   diazepam (VALIUM) 5 MG tablet Take 5 mg by mouth daily as needed. For upcoming oral surgeon     EPINEPHrine  (NEFFY ) 2 MG/0.1ML SOLN Place 1 spray into the nose once as needed for up to 1 dose. 4 each 1   fluticasone  (FLONASE ) 50 MCG/ACT nasal spray Place 2 sprays into both nostrils in the morning and at bedtime. 11 mL 6   levocetirizine (XYZAL ) 5 MG tablet Take 1 tablet by mouth at bedtime.     levocetirizine (XYZAL ) 5 MG tablet Take 1 tablet (5 mg total) by mouth every evening. 90 tablet 1   Olopatadine-Mometasone (RYALTRIS ) 665-25 MCG/ACT SUSP Place 2 sprays into the nose 2 (two) times daily as needed. 29 g 5   No current facility-administered medications for this visit.   BP (!) 112/59 (BP Location: Left Arm, Patient Position: Sitting, Cuff Size: Large)   Pulse 63   Ht 5' 4 (1.626 m)   SpO2 92%   BMI 28.84 kg/m   PHYSICAL EXAM:  BP (!) 112/59 (BP Location: Left Arm, Patient Position: Sitting, Cuff Size: Large)   Pulse 63  Ht 5' 4 (1.626 m)   SpO2 92%   BMI 28.84 kg/m    Salient findings:  CN II-XII intact Left EAC: clear; TM with T-tube in place, patent but no granulation surrounding; noted impaction with thick mucoid/slight mucopurulent debris which is stable and was suctioned; unable to appreciate frank cholesteatoma or granulation with patent T-tube. Right: EAC clear and TM intact with well pneumatized middle ear spaces Weber 512: left Rinne 512: AC > BC b/l  Nose: Anterior rhinoscopy reveals mild nasal septal deviation and bilateral inferior turbinate hypertrophy. No lesions of oral cavity/oropharynx No respiratory distress or stridor   Procedures: None today    ASSESSMENT:  Ms. Dietzman is a 58 y.o. F with:  1. 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 Otorrhea of left ear    4. Dysfunction of left eustachian tube   5. Chronic mastoiditis of left side   6. Tobacco use disorder   7. Seasonal allergic rhinitis due to pollen    She seems to have intermittent recurrent problems with the left ear ongoing for several years with PE tube in place for several years. No antecedent event for the drainage, but now chronic despite drop use which is hard to clear. Likely biofilm and based on imaging, likely chronic mastoiditis.  Given this, we discussed options and she opted for tympanomastoidectomy.  Risks of tympanomastoidectomy discussed including: Bleeding, infection, pain, scar, change in hearing, vertigo/dizziness, tinnitus, facial nerve paralysis (<1%), change in taste, CSF leak,  tympanic membrane perforation, external auditory canal stenosis, failure of graft, extrusion or poor position of prosthesis if placed, recurrence of disease, need for additional procedures, risks of anesthesia.  She wishes to proceed. Wishes to schedule next year  Ciprodex  drops BID x14d - refilled and continue Continue flonase  and navage F/u POD 7  See below regarding exact medications prescribed this encounter including dosages and route: Meds ordered this encounter  Medications   ciprofloxacin -dexamethasone  (CIPRODEX ) OTIC suspension    Sig: Place 4 drops into the left ear 2 (two) times daily for 14 days.    Dispense:  7.5 mL    Refill:  0     Thank you for allowing me the opportunity to care for your patient. Please do not hesitate to contact me should you have any other questions.  Sincerely, Eldora Blanch, MD Otolaryngologist (ENT), [facility] Phone: [phone] Fax: [phone]  MDM:  Level 4: 99214 Complexity/Problems addressed: multiple chronic problems, one with exacerbation Data complexity: mod - independent CT interpretation - Morbidity: mod  - Prescription Drug prescribed or managed: yes  11/01/2023, 8:37 AM

## 2024-01-21 ENCOUNTER — Encounter (INDEPENDENT_AMBULATORY_CARE_PROVIDER_SITE_OTHER): Payer: Self-pay

## 2024-01-21 ENCOUNTER — Telehealth (INDEPENDENT_AMBULATORY_CARE_PROVIDER_SITE_OTHER): Payer: Self-pay | Admitting: Otolaryngology

## 2024-01-21 NOTE — Telephone Encounter (Signed)
 Sent MyChart message and Left voice message for patient to call back to reschedule your post-op appointment currently scheduled for 02/14/2024.  Dr Tobie not in office.  Try to move to Tuesday, Feb. 3rd.

## 2024-02-14 ENCOUNTER — Encounter (INDEPENDENT_AMBULATORY_CARE_PROVIDER_SITE_OTHER): Admitting: Otolaryngology

## 2024-02-19 ENCOUNTER — Encounter (INDEPENDENT_AMBULATORY_CARE_PROVIDER_SITE_OTHER): Admitting: Otolaryngology

## 2024-06-12 ENCOUNTER — Encounter (INDEPENDENT_AMBULATORY_CARE_PROVIDER_SITE_OTHER): Admitting: Otolaryngology
